# Patient Record
Sex: Female | Born: 1955 | Race: Asian | Hispanic: No | Marital: Married | Smoking: Never smoker
Health system: Southern US, Community
[De-identification: ages and names within clinical notes are randomized; demographics above are authoritative.]

## PROBLEM LIST (undated history)

## (undated) DIAGNOSIS — E785 Hyperlipidemia, unspecified: Secondary | ICD-10-CM

## (undated) DIAGNOSIS — I2510 Atherosclerotic heart disease of native coronary artery without angina pectoris: Secondary | ICD-10-CM

## (undated) HISTORY — PX: CHOLECYSTECTOMY: SHX55

## (undated) HISTORY — DX: Hyperlipidemia, unspecified: E78.5

## (undated) HISTORY — PX: ABDOMINAL HYSTERECTOMY: SHX81

---

## 1999-10-12 ENCOUNTER — Ambulatory Visit (HOSPITAL_COMMUNITY): Admission: RE | Admit: 1999-10-12 | Discharge: 1999-10-12 | Payer: Self-pay | Admitting: Cardiovascular Disease

## 2005-03-01 ENCOUNTER — Emergency Department (HOSPITAL_COMMUNITY): Admission: EM | Admit: 2005-03-01 | Discharge: 2005-03-02 | Payer: Self-pay | Admitting: Emergency Medicine

## 2008-10-06 ENCOUNTER — Encounter: Admission: RE | Admit: 2008-10-06 | Discharge: 2008-10-06 | Payer: Self-pay | Admitting: Internal Medicine

## 2008-10-12 ENCOUNTER — Encounter: Admission: RE | Admit: 2008-10-12 | Discharge: 2008-10-12 | Payer: Self-pay | Admitting: Internal Medicine

## 2009-10-20 ENCOUNTER — Encounter: Admission: RE | Admit: 2009-10-20 | Discharge: 2009-10-20 | Payer: Self-pay | Admitting: Internal Medicine

## 2010-09-28 ENCOUNTER — Other Ambulatory Visit: Payer: Self-pay | Admitting: Internal Medicine

## 2010-09-28 DIAGNOSIS — Z1231 Encounter for screening mammogram for malignant neoplasm of breast: Secondary | ICD-10-CM

## 2010-10-26 ENCOUNTER — Ambulatory Visit
Admission: RE | Admit: 2010-10-26 | Discharge: 2010-10-26 | Disposition: A | Discharge status: Home / Self Care | Payer: BC Managed Care – PPO | Source: Ambulatory Visit | Attending: Internal Medicine | Admitting: Internal Medicine

## 2010-10-26 DIAGNOSIS — Z1231 Encounter for screening mammogram for malignant neoplasm of breast: Secondary | ICD-10-CM

## 2010-10-27 ENCOUNTER — Ambulatory Visit: Payer: Self-pay

## 2010-10-31 ENCOUNTER — Other Ambulatory Visit: Payer: Self-pay | Admitting: Internal Medicine

## 2010-10-31 DIAGNOSIS — Z1231 Encounter for screening mammogram for malignant neoplasm of breast: Secondary | ICD-10-CM

## 2011-06-13 HISTORY — PX: CARDIAC CATHETERIZATION: SHX172

## 2011-09-28 ENCOUNTER — Emergency Department (HOSPITAL_COMMUNITY): Payer: BC Managed Care – PPO

## 2011-09-28 ENCOUNTER — Emergency Department (HOSPITAL_COMMUNITY)
Admission: EM | Admit: 2011-09-28 | Discharge: 2011-09-28 | Disposition: A | Discharge status: Home / Self Care | Payer: BC Managed Care – PPO | Attending: Emergency Medicine | Admitting: Emergency Medicine

## 2011-09-28 ENCOUNTER — Encounter (HOSPITAL_COMMUNITY): Payer: Self-pay | Admitting: *Deleted

## 2011-09-28 DIAGNOSIS — M25519 Pain in unspecified shoulder: Secondary | ICD-10-CM | POA: Insufficient documentation

## 2011-09-28 DIAGNOSIS — R079 Chest pain, unspecified: Secondary | ICD-10-CM | POA: Insufficient documentation

## 2011-09-28 DIAGNOSIS — R209 Unspecified disturbances of skin sensation: Secondary | ICD-10-CM | POA: Insufficient documentation

## 2011-09-28 LAB — POCT I-STAT TROPONIN I: Troponin i, poc: 0.01 ng/mL (ref 0.00–0.08)

## 2011-09-28 LAB — BASIC METABOLIC PANEL
BUN: 10 mg/dL (ref 6–23)
CO2: 24 mEq/L (ref 19–32)
Calcium: 9.8 mg/dL (ref 8.4–10.5)
Chloride: 102 mEq/L (ref 96–112)
Creatinine, Ser: 0.79 mg/dL (ref 0.50–1.10)
GFR calc Af Amer: >90 mL/min (ref >90)
GFR calc non Af Amer: >90 mL/min (ref >90)
Glucose, Bld: 98 mg/dL (ref 70–99)
Potassium: 3.2 mEq/L — ABNORMAL LOW (ref 3.5–5.1)
Sodium: 140 mEq/L (ref 135–145)

## 2011-09-28 LAB — DIFFERENTIAL
Basophils Relative: 1 % (ref 0–1)
Eosinophils Absolute: 0.1 10*3/uL (ref 0.0–0.7)
Eosinophils Relative: 2 % (ref 0–5)
Lymphocytes Relative: 21 % (ref 12–46)
Neutro Abs: 5.2 10*3/uL (ref 1.7–7.7)

## 2011-09-28 LAB — CBC
Hemoglobin: 13.5 g/dL (ref 12.0–15.0)
MCH: 29.7 pg (ref 26.0–34.0)
MCHC: 35.2 g/dL (ref 30.0–36.0)
RBC: 4.54 MIL/uL (ref 3.87–5.11)
WBC: 7.3 10*3/uL (ref 4.0–10.5)

## 2011-09-28 MED ORDER — POTASSIUM CHLORIDE CRYS ER 20 MEQ PO TBCR
40.0000 meq | EXTENDED_RELEASE_TABLET | Freq: Once | ORAL | Status: AC
  Administered 2011-09-28: 40 meq via ORAL
  Filled 2011-09-28: qty 5

## 2011-09-28 MED ORDER — ASPIRIN 81 MG PO CHEW
324.0000 mg | CHEWABLE_TABLET | Freq: Once | ORAL | Status: AC
  Administered 2011-09-28: 324 mg via ORAL
  Filled 2011-09-28: qty 4

## 2011-09-28 NOTE — ED Provider Notes (Signed)
Medical screening examination/treatment/procedure(s) were performed by non-physician practitioner and as supervising physician I was immediately available for consultation/collaboration.  Kyriaki Moder, MD 09/28/11 1748 

## 2011-09-28 NOTE — ED Notes (Signed)
Patient undressed and in a gown. Cardiac monitor, pulse oximetry, and blood pressure cuff on. 

## 2011-09-28 NOTE — ED Notes (Addendum)
Patient denies pain and is resting comfortably.  

## 2011-09-28 NOTE — ED Notes (Signed)
Pt reports chest pain that started this am, has tingling sensation down left arm. No distress noted, ekg done at triage.

## 2011-09-28 NOTE — ED Notes (Signed)
Pt returned from radiology.

## 2011-09-28 NOTE — ED Notes (Signed)
Patient transported to X-ray 

## 2011-09-28 NOTE — Discharge Instructions (Signed)
Continue taking your daily baby aspirin. Call [facility] cardiology tomorrow to verify followup appointment with either Dr. Johney Frame or one of the other cardiologists in 1-2 weeks. Explain that you were seen in the emergency department today and that Dr. Johney Frame took your information to have close followup established in 1-2 weeks with either him or a partner in the group. If you choose to go to [facility] to have heart catheterization then you need to call [facility] cardiology to cancel the followup appointment. However, at any time, if you have changing or worsening of symptoms to include but not limited to worsening chest pain then return to the closest ER for further evaluation.  Chest Pain (Nonspecific) Chest pain has many causes. Your pain could be caused by something serious, such as a heart attack or a blood clot in the lungs. It could also be caused by something less serious, such as a chest bruise or a virus. Follow up with your doctor. More lab tests or other studies may be needed to find the cause of your pain. Most of the time, nonspecific chest pain will improve within 2 to 3 days of rest and mild pain medicine. HOME CARE  For chest bruises, you may put ice on the sore area for 15 to 20 minutes, 3 to 4 times a day. Do this only if it makes you or your child feel better.   Put ice in a plastic bag.   Place a towel between the skin and the bag.   Rest for the next 2 to 3 days.   Go back to work if the pain improves.   See your doctor if the pain lasts longer than 1 to 2 weeks.   Only take medicine as told by your doctor.   Quit smoking if you smoke.  GET HELP RIGHT AWAY IF:   There is more pain or pain that spreads to the arm, neck, jaw, back, or belly (abdomen).   You or your child has shortness of breath.   You or your child coughs more than usual or coughs up blood.   You or your child has very bad back or belly pain, feels sick to his or her stomach (nauseous), or throws up  (vomits).   You or your child has very bad weakness.   You or your child passes out (faints).   You or your child has a temperature by mouth above 102 F (38.9 C), not controlled by medicine.  Any of these problems may be serious and may be an emergency. Do not wait to see if the problems will go away. Get medical help right away. Call your local emergency services 911 in [place] Do not drive yourself to the hospital. MAKE SURE YOU:   Understand these instructions.   Will watch this condition.   Will get help right away if you or your child is not doing well or gets worse.  Document Released: 11/15/2007 Document Revised: 05/18/2011 Document Reviewed: 11/15/2007 [facility] Patient Information 2012 [place], [place].

## 2011-09-28 NOTE — ED Provider Notes (Signed)
History     CSN: [id number]  Arrival date & time 09/28/11  0844   First MD Initiated Contact with Patient 09/28/11 [phone]      Chief Complaint  Patient presents with  . Chest Pain    (Consider location/radiation/quality/duration/timing/severity/associated sxs/prior treatment) HPI  Patient presents to emergency department complaining of acute onset left-sided chest discomfort with radiation of discomfort up into her upper left shoulder and tingling into her left arm. Patient states that she got up this morning feeling well and did chores around the house, took a shower, and then sat down to do prayers. Patient states that shortly after sitting down she began to feel "not well" and then had acute onset discomfort in the left side of her chest radiating up into her left upper shoulder with tingling in her left arm. Patient states symptoms lasted for seconds to minutes and then resolved. Patient states symptoms began around 6:30 AM this morning. Patient states that since onset she's had 3-4 similar episodes, lasting for seconds to minutes a piece. Patient denies associated fevers, chills, cough, shortness of breath, nausea, vomiting, diaphoresis, abdominal pain. Patient denies history of similar events. Patient states she takes a daily baby aspirin but has no known medical problems and takes no other medicines on regular basis. Patient has been seen by Dr. Allena Katz in [place] in the past but states she has not seen her recently. Patient denies alcohol, tobacco, and illicit drug use. Patient states that her mother died at age 51 of a heart attack without other known medical problems. Patient denies any recent illness. She denies lower extremity pain or swelling, exogenous estrogen use, recent travel or immobilization. She denies aggravating or alleviating factors. Patient states symptoms are acute onset, fleeting, and resolved. Patient states she has no pain currently, resting comfortably in bed without  complaint. Patient took her daily baby aspirin today. Patient states she saw Dr. Algie Coffer approximately 15 years ago for "heart check up" but states that her evaluation was normal and has not seen him since.   Patient was in [place] last week visiting her daughter and went to see a cardiologist who is a family friend with her husband and states she received a cardiac stress test there which was normal as well as having "normal labs." patient denies any CP at that time but states they were "being cautious because of my mom dying at 95." Patient states that the same cardiologist told her that he would perform a cardiac cath next week when she returns to [facility] to further rule out heart disease.   History reviewed. No pertinent past medical history.  History reviewed. No pertinent past surgical history.  History reviewed. No pertinent family history.  History  Substance Use Topics  . Smoking status: Not on file  . Smokeless tobacco: Not on file  . Alcohol Use: No    OB History    Grav Para Term Preterm Abortions TAB SAB Ect Mult Living                  Review of Systems  All other systems reviewed and are negative.    Allergies  Iodine  Home Medications   Current Outpatient Rx  Name Route Sig Dispense Refill  . ASPIRIN EC 81 MG PO TBEC Oral Take 81 mg by mouth daily.      BP 128/71  Pulse 89  Temp(Src) 98.4 F (36.9 C) (Oral)  Resp 20  SpO2 100%  Physical Exam  Nursing note  and vitals reviewed. Constitutional: She is oriented to person, place, and time. She appears well-developed and well-nourished. No distress.  HENT:  Head: Normocephalic and atraumatic.  Eyes: Conjunctivae are normal.  Neck: Normal range of motion. Neck supple.  Cardiovascular: Normal rate, regular rhythm, normal heart sounds and intact distal pulses.  Exam reveals no gallop and no friction rub.   No murmur heard. Pulmonary/Chest: Effort normal and breath sounds normal. No respiratory  distress. She has no wheezes. She has no rales. She exhibits no tenderness.  Abdominal: Soft. Bowel sounds are normal. She exhibits no distension and no mass. There is no tenderness. There is no rebound and no guarding.  Musculoskeletal: Normal range of motion. She exhibits no edema and no tenderness.  Neurological: She is alert and oriented to person, place, and time.  Skin: Skin is warm and dry. No rash noted. She is not diaphoretic. No erythema.  Psychiatric: She has a normal mood and affect.    ED Course  Procedures (including critical care time)  PO ASA to total 325mg    Date: 09/28/2011  Rate: 83  Rhythm: normal sinus rhythm  QRS Axis: normal  Intervals: normal  ST/T Wave abnormalities: nonspecific ST changes  Conduction Disutrbances:none  Narrative Interpretation:   Old EKG Reviewed: non provocative compared to Mar 01, 2005 with mild non specific ST depression inferiorly   Labs Reviewed  BASIC METABOLIC PANEL - Abnormal; Notable for the following:    Potassium 3.2 (*)    All other components within normal limits  CBC  DIFFERENTIAL  POCT I-STAT TROPONIN I   Dg Chest 2 View  09/28/2011  *RADIOLOGY REPORT*  Clinical Data: Chest pain  CHEST - 2 VIEW  Comparison: March 02, 2005  Findings: The cardiac silhouette, mediastinum, pulmonary vasculature are within normal limits.  Both lungs are clear. There is no acute bony abnormality.  IMPRESSION: There is no evidence of acute cardiac or pulmonary process.  Original Report Authenticated By: Brandon Melnick, M.D.     1. Chest pain        MDM   Patient had stress test/echo results faxed to emergency department from [facility] cardiovascular [facility] office in [place] on 08/30/11. Stress test and echo showed no ischemic changes. I spoke with Dr. Johney Frame about patient's HPI, ER findings, and recent stress test and he states that patient, despite family history of coronary artery disease, is very low-risk and appropriate  for outpatient followup with he or one of his partners in 1-2 weeks. He took the patient's information to establish followup. However, he states that if patient chooses to go to [facility] to have cardiac catheterization by another cardiologist then patient should cancel that appointment. Patient's future cardiology needs will be based on catheterization findings and or followup exam with cardiology in [place].  I gave strict precautions to patient and her son who are at bedside about reasons to return to closest ER. Patient voices her understanding and is agreeable to plan.       Jenness Corner, [place] 09/28/11 1208

## 2011-09-29 NOTE — Progress Notes (Signed)
Observation review is complete. [name] is being sent.

## 2011-10-17 ENCOUNTER — Telehealth: Payer: Self-pay | Admitting: Internal Medicine

## 2011-10-17 NOTE — Telephone Encounter (Signed)
10-17-11 per staff message from allred 09-28-11 pt was seen in the er, they wanted pt to be seen , per allred with one of the noninvasive dr's next available. 09-29-11 lmm to call , 10-03-11 lmm to call , and 10-05-11 lmm to call/mt

## 2011-11-03 ENCOUNTER — Other Ambulatory Visit: Payer: Self-pay | Admitting: Internal Medicine

## 2011-11-03 DIAGNOSIS — Z1231 Encounter for screening mammogram for malignant neoplasm of breast: Secondary | ICD-10-CM

## 2011-11-20 ENCOUNTER — Ambulatory Visit: Payer: BC Managed Care – PPO

## 2011-11-21 ENCOUNTER — Ambulatory Visit
Admission: RE | Admit: 2011-11-21 | Discharge: 2011-11-21 | Disposition: A | Discharge status: Home / Self Care | Payer: BC Managed Care – PPO | Source: Ambulatory Visit | Attending: Internal Medicine | Admitting: Internal Medicine

## 2011-11-21 DIAGNOSIS — Z1231 Encounter for screening mammogram for malignant neoplasm of breast: Secondary | ICD-10-CM

## 2012-09-26 ENCOUNTER — Encounter (HOSPITAL_COMMUNITY): Payer: Self-pay | Admitting: Emergency Medicine

## 2012-09-26 ENCOUNTER — Emergency Department (HOSPITAL_COMMUNITY): Payer: BC Managed Care – PPO

## 2012-09-26 ENCOUNTER — Observation Stay (HOSPITAL_COMMUNITY)
Admission: EM | Admit: 2012-09-26 | Discharge: 2012-09-27 | Disposition: A | Discharge status: Home / Self Care | Payer: BC Managed Care – PPO | Attending: Cardiology | Admitting: Cardiology

## 2012-09-26 DIAGNOSIS — I2510 Atherosclerotic heart disease of native coronary artery without angina pectoris: Secondary | ICD-10-CM | POA: Insufficient documentation

## 2012-09-26 DIAGNOSIS — R072 Precordial pain: Principal | ICD-10-CM | POA: Insufficient documentation

## 2012-09-26 DIAGNOSIS — I209 Angina pectoris, unspecified: Secondary | ICD-10-CM

## 2012-09-26 DIAGNOSIS — R079 Chest pain, unspecified: Secondary | ICD-10-CM

## 2012-09-26 DIAGNOSIS — R0602 Shortness of breath: Secondary | ICD-10-CM | POA: Insufficient documentation

## 2012-09-26 HISTORY — DX: Atherosclerotic heart disease of native coronary artery without angina pectoris: I25.10

## 2012-09-26 LAB — CBC
HCT: 34.6 % — ABNORMAL LOW (ref 36.0–46.0)
Hemoglobin: 12.2 g/dL (ref 12.0–15.0)
MCH: 29.5 pg (ref 26.0–34.0)
MCHC: 35.3 g/dL (ref 30.0–36.0)

## 2012-09-26 LAB — BASIC METABOLIC PANEL
BUN: 11 mg/dL (ref 6–23)
Calcium: 9.3 mg/dL (ref 8.4–10.5)
GFR calc non Af Amer: >90 mL/min (ref >90)
Glucose, Bld: 94 mg/dL (ref 70–99)

## 2012-09-26 MED ORDER — ASPIRIN 81 MG PO CHEW
162.0000 mg | CHEWABLE_TABLET | Freq: Once | ORAL | Status: AC
  Administered 2012-09-26: 162 mg via ORAL
  Filled 2012-09-26: qty 2

## 2012-09-26 MED ORDER — NITROGLYCERIN 0.4 MG SL SUBL
0.4000 mg | SUBLINGUAL_TABLET | SUBLINGUAL | Status: DC | PRN
  Administered 2012-09-26 – 2012-09-27 (×2): 0.4 mg via SUBLINGUAL
  Filled 2012-09-26: qty 25

## 2012-09-26 NOTE — ED Notes (Signed)
The pt  Is c/o lt upper chest pain since this am no sob no nausea no sob.Marland Kitchen  No previous history..  Pt alert no distress

## 2012-09-26 NOTE — ED Notes (Signed)
Pt c/o left shoulder and left sided neck pain with generalized weakness starting today; pt denies sob

## 2012-09-27 ENCOUNTER — Encounter (HOSPITAL_COMMUNITY): Payer: Self-pay | Admitting: Physician Assistant

## 2012-09-27 DIAGNOSIS — R079 Chest pain, unspecified: Secondary | ICD-10-CM

## 2012-09-27 DIAGNOSIS — I2510 Atherosclerotic heart disease of native coronary artery without angina pectoris: Secondary | ICD-10-CM

## 2012-09-27 DIAGNOSIS — R072 Precordial pain: Principal | ICD-10-CM | POA: Diagnosis present

## 2012-09-27 LAB — CBC
HCT: 31.7 % — ABNORMAL LOW (ref 36.0–46.0)
MCH: 28.8 pg (ref 26.0–34.0)
MCHC: 34.4 g/dL (ref 30.0–36.0)
MCV: 83.9 fL (ref 78.0–100.0)
RDW: 13.1 % (ref 11.5–15.5)

## 2012-09-27 LAB — CREATININE, SERUM: GFR calc non Af Amer: >90 mL/min (ref >90)

## 2012-09-27 LAB — TROPONIN I: Troponin I: <0.3 ng/mL (ref <0.30)

## 2012-09-27 MED ORDER — ENOXAPARIN SODIUM 40 MG/0.4ML ~~LOC~~ SOLN
40.0000 mg | SUBCUTANEOUS | Status: DC
  Administered 2012-09-27: 40 mg via SUBCUTANEOUS
  Filled 2012-09-27: qty 0.4

## 2012-09-27 MED ORDER — ASPIRIN EC 81 MG PO TBEC
81.0000 mg | DELAYED_RELEASE_TABLET | Freq: Every day | ORAL | Status: DC
  Filled 2012-09-27: qty 1

## 2012-09-27 MED ORDER — SODIUM CHLORIDE 0.9 % IV SOLN: 250.0000 mL | INTRAVENOUS | Status: DC | PRN

## 2012-09-27 MED ORDER — ATORVASTATIN CALCIUM 20 MG PO TABS
20.0000 mg | ORAL_TABLET | Freq: Every day | ORAL | Status: DC
  Filled 2012-09-27: qty 1

## 2012-09-27 MED ORDER — SODIUM CHLORIDE 0.9 % IJ SOLN: 3.0000 mL | INTRAMUSCULAR | Status: DC | PRN

## 2012-09-27 MED ORDER — ZOLPIDEM TARTRATE 5 MG PO TABS: 5.0000 mg | ORAL_TABLET | Freq: Every evening | ORAL | Status: DC | PRN

## 2012-09-27 MED ORDER — ALPRAZOLAM 0.25 MG PO TABS: 0.2500 mg | ORAL_TABLET | Freq: Two times a day (BID) | ORAL | Status: DC | PRN

## 2012-09-27 MED ORDER — SODIUM CHLORIDE 0.9 % IJ SOLN: 3.0000 mL | Freq: Two times a day (BID) | INTRAMUSCULAR | Status: DC

## 2012-09-27 MED ORDER — NITROGLYCERIN 0.4 MG SL SUBL: 0.4000 mg | SUBLINGUAL_TABLET | SUBLINGUAL | Status: DC | PRN

## 2012-09-27 MED ORDER — VITAMIN B-12 1000 MCG PO TABS
1000.0000 ug | ORAL_TABLET | Freq: Every day | ORAL | Status: DC
  Administered 2012-09-27: 1000 ug via ORAL
  Filled 2012-09-27 (×2): qty 1

## 2012-09-27 MED ORDER — ONDANSETRON HCL 4 MG/2ML IJ SOLN: 4.0000 mg | Freq: Four times a day (QID) | INTRAMUSCULAR | Status: DC | PRN

## 2012-09-27 MED ORDER — ACETAMINOPHEN 325 MG PO TABS: 650.0000 mg | ORAL_TABLET | ORAL | Status: DC | PRN

## 2012-09-27 NOTE — H&P (Signed)
CARDIOLOGY HISTORY AND PHYSICAL   Patient ID: Renee Fuentes MRN: [id number] DOB/AGE: 14-Aug-1955 57 y.o.  Admit date: 09/26/2012  Primary Physician   No PCP Per Patient Primary Cardiologist  Lennox Pippins M.D., [facility]   [address] [place], [place] [address] [phone]  Reason for Consultation   Chest pain  HQI:[id number] Renee Fuentes is a 57 y.o. female with a possible history of CAD, but no PCI.  She was in her usual state of health yesterday morning when she had onset of left-sided chest pain at approximately 10 AM. She had been active around the house doing things such as cooking and cleaning but denied shortness of breath or significant fatigue from these activities. The chest pain lasted about 15 minutes. She describes it as an uneasiness in her left upper chest area. She is not able to qualify it further. It was a 2/10. She may have been a little short of breath but was not nauseated or diaphoretic. She did well through lunch and after lunch and was busy can the afternoon when she had another episode of pain at 4 PM. This pain lasted a little bit longer. She felt a little dizzy. She came to the emergency room at 7 PM. The patient does not remember complaining of pain, but per ER records, she was having 7/10 chest pain. She was given aspirin 81 mg x2 and sublingual nitroglycerin x1. The chest pain improved but returned at 1 AM and she required another sublingual nitroglycerin. She did not have any pain overnight but during the interview she again developed left-sided chest pain. She seemed to be a little uncomfortable. She is concerned about the chest pain because her mother died at 56 with an MI.   Past Medical History  Diagnosis Date  . H/O cardiovascular stress test 2013    in [place]     Past Surgical History  Procedure Laterality Date  . Cardiac catheterization  2013    Performed in [facility], patient was told she had a stenosis but no PCI was performed.    Allergies   Allergen Reactions  . Iodine Itching and Swelling    I have reviewed the patient's current medications     nitroGLYCERIN  Prior to Admission medications   Medication Sig Start Date End Date Taking? Authorizing Provider  aspirin EC 81 MG tablet Take 81 mg by mouth daily.   Yes Historical Provider, MD  Cyanocobalamin (VITAMIN B-12 PO) Take 1 tablet by mouth daily.   Yes Historical Provider, MD  rosuvastatin (CRESTOR) 10 MG tablet Take 10 mg by mouth daily.   Yes Historical Provider, MD     History   Social History  . Marital Status: Married    Spouse Name: N/A    Number of Children: N/A  . Years of Education: N/A   Occupational History  . Self-employed    Social History Main Topics  . Smoking status: Never Smoker   . Smokeless tobacco: Not on file  . Alcohol Use: No  . Drug Use: No  . Sexually Active: Not on file   Other Topics Concern  . 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 .   No siblings have cardiac issues.    Family Status  Relation Status Death Age  . Mother Deceased 34    MI  . Father Alive     Currently 57 years old, no cardiac issues   ROS:  Full  14 point review of systems complete and found to be negative unless listed above.  Physical Exam: Blood pressure 118/57, pulse 56, temperature 97.7 F (36.5 C), temperature source Oral, resp. rate 12, SpO2 100.00%.  General: Well developed, well nourished, female in no acute distress Head: Eyes PERRLA, No xanthomas.   Normocephalic and atraumatic, oropharynx without edema or exudate. Dentition: Good Lungs: CTA bilaterally Heart: HRRR S1 S2, no rub/gallop, no murmur. pulses are 2+ all 4 extrem.   Neck: No carotid bruits. No lymphadenopathy.  JVD not elevated. Abdomen: Bowel sounds present, abdomen soft and non-tender without masses or hernias noted. Msk:  No spine or cva tenderness. No weakness, no joint deformities or effusions. Extremities: No clubbing or  cyanosis. No edema.  Neuro: Alert and oriented X 3. No focal deficits noted. Psych:  Good affect, responds appropriately Skin: No rashes or lesions noted.  Labs:   Lab Results  Component Value Date   WBC 5.5 09/26/2012   HGB 12.2 09/26/2012   HCT 34.6* 09/26/2012   MCV 83.6 09/26/2012   PLT 183 09/26/2012     Recent Labs Lab 09/26/12 1859  NA 144  K 3.7  CL 109  CO2 27  BUN 11  CREATININE 0.73  CALCIUM 9.3  GLUCOSE 94         Recent Labs  09/26/12 2345  TROPONINI <0.30    Recent Labs  09/26/12 1921  TROPIPOC 0.00    ECG:  SR, s brady with no acute ischemic changes.  Radiology:  Dg Chest 2 View 09/26/2012  *RADIOLOGY REPORT*  Clinical Data: Left-sided chest and shoulder pain  CHEST - 2 VIEW  Comparison: Chest x-ray of 09/28/2011  Findings: The lungs remain slightly hyperaerated with chronic changes.  No active infiltrate or effusion is seen.  Mediastinal contours are stable.  The heart is within upper limits of normal. No acute bony abnormality is seen.  IMPRESSION: Question mild emphysema.  No definite active process.   Original Report Authenticated By: Dwyane Dee, M.D.     ASSESSMENT AND PLAN:   The patient was seen today by Dr Shirlee Latch, the patient evaluated and the data reviewed.  Principal Problem:   Precordial pain - patient symptoms have typical and atypical teachers. She has had both resting and exertional pain. She got some improvement with sublingual nitroglycerin. Her cardiac risk factors are age and family history. She can be admitted to observation. We will continue to cycle cardiac enzymes. We will check a 2-D echocardiogram. If her cardiac enzymes remain negative, consider outpatient exercise Myoview. We'll contact her [place] physician for records.   SignedTheodore Demark, PA-C 09/27/2012 8:40 AM Alvino Chapel [phone]  Co-Sign MD  Patient seen with PA, agree with note.  Apparently had nonobstructive CAD on cath last year in [place].  She had somewhat  atypical chest pain yesterday.  ECG was unremarkable and cardiac enzymes so far negative.  Will plan to get an echo and serial cardiac enzymes/ECGs.  If she continues to have negative enzymes, will let her go home later today and followup for outpatient myoview on Monday.   Marca Ancona 09/27/2012 11:18 AM

## 2012-09-27 NOTE — ED Notes (Signed)
Patient resting with eyes closed

## 2012-09-27 NOTE — Discharge Summary (Signed)
Discharge Summary   Patient ID: Renee Fuentes,  MRN: [id number], DOB/AGE: 06/23/1955 57 y.o.  Admit date: 09/26/2012 Discharge date: 09/27/2012  Primary Physician: No PCP Per Patient Primary Cardiologist: Lennox Pippins M.D., [facility]  [address] [place], [place] [address] [phone]   Discharge Diagnoses Principal Problem:   Precordial pain Active Problems:   CAD (coronary artery disease), native coronary artery   Allergies Allergies  Allergen Reactions  . Iodine Itching and Swelling    Diagnostic Studies/Procedures  PA/LATERAL CHEST X-RAY- 09/26/12  IMPRESSION:  Question mild emphysema. No definite active process.    History of Present Illness Renee Fuentes is a 57 y.o. female who was observed overnight at [facility] with the above problem list.  She has a history of CAD- underwent cardiac catheterization in [place] in 2013. Underlying CAD was apparently nonobstructive. Did not require PCI. She sees a cardiologist in [place]. Her mother passed at 44 from a MI.   She was in her [place] yesterday morning when she experienced sudden-onset left-sided chest pain ~ 10 AM lasting about 15 minutes, located in the left upper chest area described as an uneasiness rated at a 2/10. She noted associated mild shortness of breath. No nausea or diaphoresis. She had been active performing household chores, and denied shortness of breath or fatigue with these activities. The pain subsided, she ate lunch, then experienced another episode around 4 PM which lasting a little longer and was associated with dizziness. She presented to the ED at 7 PM.   There, EKG revealed sinus bradycardia with nonspecific ST depressions < 1 mm in V4-V6, II, III, aVF. Initial trop-I WNL. CBC and BMET unremarkable. CXR as above indicated questionable mild emphysema, but no definite active process. She received ASA 81 x 2 and NTG SL x 1 with improvement. She remained in the ED overnight, and did require  an additional NTG for chest discomfort which did alleviate her pain.   Hospital Course   She was observed overnight and cardiac biomarkers were cycled. Two subsequent sets of trop-I returned WNL. She remained pain free and was deemed stable for discharge by Dr. Shirlee Latch. She was scheduled for an outpatient nuclear stress test and follow-up in the office as an outpatient. She will resume prior outpatient medications. This information, including outpatient stress test instructions, has been clearly outlined in the discharge AVS.   Discharge Vitals:  Blood pressure 111/71, pulse 79, temperature 98.5 F (36.9 C), temperature source Oral, resp. rate 14, height 5\' 2"  (1.575 m), weight 58.695 kg (129 lb 6.4 oz), SpO2 99.00%.   Labs: Recent Labs     09/26/12  1859  09/27/12  1154  WBC  5.5  4.7  HGB  12.2  10.9*  HCT  34.6*  31.7*  MCV  83.6  83.9  PLT  183  153    Recent Labs Lab 09/26/12 1859 09/27/12 1154  NA 144  --   K 3.7  --   CL 109  --   CO2 27  --   BUN 11  --   CREATININE 0.73 0.72  CALCIUM 9.3  --   GLUCOSE 94  --    Recent Labs     09/26/12  2345  09/27/12  1154  TROPONINI  <0.30  <0.30   Disposition:   Future Appointments Provider Department Dept Phone   10/07/2012 9:30 AM [facility] ([place] SITE 3 NUCLEAR MED [phone]   10/14/2012 10:10 AM Lorin Picket  Moishe Spice, PA-C [place] [facility] [place]) [phone]         Follow-up Information   Follow up with [facility] Main [facility]) On 10/07/2012. (9:00 am)    Contact information:   [address] [place] [address] [phone]      Follow up with Tereso Newcomer, PA-C On 10/14/2012. (at 10:10 am)    Contact information:   [address] [place] [address] [phone]      Discharge Medications:    Medication List    TAKE these medications       aspirin EC 81 MG tablet  Take 81 mg by mouth daily.      nitroGLYCERIN 0.4 MG SL tablet  Commonly known as:  NITROSTAT  Place 1 tablet (0.4 mg total) under the tongue every 5 (five) minutes as needed for chest pain.     rosuvastatin 10 MG tablet  Commonly known as:  CRESTOR  Take 10 mg by mouth daily.     VITAMIN B-12 PO  Take 1 tablet by mouth daily.       Outstanding Labs/Studies: nuclear stress test 10/07/12  Duration of Discharge Encounter: Greater than 30 minutes including physician time.  Signed, R. Hurman Horn, PA-C 09/27/2012, 5:02 PM

## 2012-09-27 NOTE — Progress Notes (Signed)
Utilization review completed.  

## 2012-09-27 NOTE — ED Notes (Signed)
BP down patient laying on her right side.  NS up

## 2012-09-27 NOTE — ED Provider Notes (Signed)
History     CSN: [id number]  Arrival date & time 09/26/12  1844   First MD Initiated Contact with Patient 09/26/12 2304      Chief Complaint  Patient presents with  . Chest Pain    (Consider location/radiation/quality/duration/timing/severity/associated sxs/prior treatment) Patient is a 57 y.o. female presenting with chest pain. The history is provided by the patient and medical records.  Chest Pain Pain location:  L chest Pain radiates to:  L shoulder Pain radiates to the back: no   Pain severity:  Moderate Onset quality:  Sudden Duration:  12 hours Timing:  Intermittent Chronicity:  New Context: not breathing, not at rest and no trauma   Relieved by:  Nitroglycerin Worsened by:  Exertion Ineffective treatments:  None tried Associated symptoms: no abdominal pain, no cough, no diaphoresis, no dizziness, no nausea, no palpitations, no shortness of breath and not vomiting     History reviewed. No pertinent past medical history.  History reviewed. No pertinent past surgical history.  History reviewed. No pertinent family history.  History  Substance Use Topics  . Smoking status: Never Smoker   . Smokeless tobacco: Not on file  . Alcohol Use: No    OB History   Grav Para Term Preterm Abortions TAB SAB Ect Mult Living                  Review of Systems  Constitutional: Negative for diaphoresis and activity change.  HENT: Negative for facial swelling and neck pain.   Respiratory: Negative for cough, shortness of breath and wheezing.   Cardiovascular: Positive for chest pain. Negative for palpitations.  Gastrointestinal: Negative for nausea, vomiting, abdominal pain, diarrhea, constipation, blood in stool and abdominal distention.  Genitourinary: Negative for hematuria and difficulty urinating.  Skin: Negative for color change.  Neurological: Negative for dizziness and speech difficulty.  Hematological: Does not bruise/bleed easily.  Psychiatric/Behavioral:  Negative for confusion.    Allergies  Iodine  Home Medications   Current Outpatient Rx  Name  Route  Sig  Dispense  Refill  . aspirin EC 81 MG tablet   Oral   Take 81 mg by mouth daily.         . Cyanocobalamin (VITAMIN B-12 PO)   Oral   Take 1 tablet by mouth daily.         . rosuvastatin (CRESTOR) 10 MG tablet   Oral   Take 10 mg by mouth daily.           BP 95/59  Pulse 52  Temp(Src) 97.7 F (36.5 C) (Oral)  Resp 16  SpO2 100%  Physical Exam  Nursing note and vitals reviewed. Constitutional: She is oriented to person, place, and time. She appears well-developed and well-nourished.  HENT:  Head: Normocephalic and atraumatic.  Eyes: EOM are normal. Pupils are equal, round, and reactive to light.  Neck: Neck supple.  Cardiovascular: Normal rate, regular rhythm and normal heart sounds.   No murmur heard. Pulmonary/Chest: Effort normal. No respiratory distress.  Abdominal: Soft. She exhibits no distension. There is no tenderness. There is no rebound and no guarding.  Neurological: She is alert and oriented to person, place, and time.  Skin: Skin is warm and dry.    ED Course  Procedures (including critical care time)  Labs Reviewed  CBC - Abnormal; Notable for the following:    HCT 34.6 (*)    All other components within normal limits  BASIC METABOLIC PANEL  TROPONIN I  POCT I-STAT  TROPONIN I   Dg Chest 2 View  09/26/2012  *RADIOLOGY REPORT*  Clinical Data: Left-sided chest and shoulder pain  CHEST - 2 VIEW  Comparison: Chest x-ray of 09/28/2011  Findings: The lungs remain slightly hyperaerated with chronic changes.  No active infiltrate or effusion is seen.  Mediastinal contours are stable.  The heart is within upper limits of normal. No acute bony abnormality is seen.  IMPRESSION: Question mild emphysema.  No definite active process.   Original Report Authenticated By: Dwyane Dee, M.D.      No diagnosis found.    MDM   Date: 09/27/2012  Rate:  78  Rhythm: normal sinus rhythm  QRS Axis: normal  Intervals: normal  ST/T Wave abnormalities: nonspecific ST/T changes  Conduction Disutrbances:none  Narrative Interpretation:   Old EKG Reviewed: new t wave inversion in v2, t wave flattening    Rate: 78  Rhythm: normal sinus rhythm  QRS Axis: normal  Intervals: normal  ST/T Wave abnormalities: nonspecific ST/T changes  Conduction Disutrbances:none  Narrative Interpretation:   Old EKG Reviewed: unchanged  Differential diagnosis includes: ACS syndrome CHF exacerbation Valvular disorder Myocarditis Pericarditis Pericardial effusion Pneumonia Pleural effusion Pulmonary edema PE Anemia Musculoskeletal pain  Pt comes in with cc of chest pain. Pt has no known CAD, and besides family hx, no risk factors. She had a cath in 2012 - at [facility], no intervention, but states that cath was not completely normal either. The pain today started with cleaning the home, and she had pain again at night post prandially. I called Cards, and they recommend close follow up at this time, unless we get the cath results suggesting CAD.  4:13 AM Pt's pain responding to nitro. States that she had pain at rest x 2 times here. Mild pain. Cath reports from significant arteriopathy, mid LAD, and distal LAD. Will call [facility] again.      Derwood Kaplan, MD 09/27/12 [phone]

## 2012-10-07 ENCOUNTER — Encounter (HOSPITAL_COMMUNITY): Payer: BC Managed Care – PPO

## 2012-10-09 ENCOUNTER — Ambulatory Visit (HOSPITAL_COMMUNITY): Payer: BC Managed Care – PPO | Attending: Cardiovascular Disease | Admitting: Radiology

## 2012-10-09 VITALS — BP 125/70 | HR 66 | Ht 62.0 in | Wt 126.0 lb

## 2012-10-09 DIAGNOSIS — I2510 Atherosclerotic heart disease of native coronary artery without angina pectoris: Secondary | ICD-10-CM

## 2012-10-09 DIAGNOSIS — R0789 Other chest pain: Secondary | ICD-10-CM | POA: Insufficient documentation

## 2012-10-09 DIAGNOSIS — Z8249 Family history of ischemic heart disease and other diseases of the circulatory system: Secondary | ICD-10-CM | POA: Insufficient documentation

## 2012-10-09 DIAGNOSIS — R0602 Shortness of breath: Secondary | ICD-10-CM | POA: Insufficient documentation

## 2012-10-09 DIAGNOSIS — R079 Chest pain, unspecified: Secondary | ICD-10-CM

## 2012-10-09 DIAGNOSIS — E785 Hyperlipidemia, unspecified: Secondary | ICD-10-CM | POA: Insufficient documentation

## 2012-10-09 MED ORDER — TECHNETIUM TC 99M SESTAMIBI GENERIC - CARDIOLITE
10.0000 | Freq: Once | INTRAVENOUS | Status: AC | PRN
  Administered 2012-10-09: 10 via INTRAVENOUS

## 2012-10-09 MED ORDER — TECHNETIUM TC 99M SESTAMIBI GENERIC - CARDIOLITE
30.0000 | Freq: Once | INTRAVENOUS | Status: AC | PRN
  Administered 2012-10-09: 30 via INTRAVENOUS

## 2012-10-09 NOTE — Progress Notes (Addendum)
[facility] SITE 3 NUCLEAR MED [address] [place], [place] [address] [phone]    Cardiology Nuclear Med Study  Renee Fuentes is a 57 y.o. female     MRN : [id number]     DOB: [birth year]  Procedure Date: 10/09/2012  Nuclear Med Background Indication for Stress Test:  Evaluation for Ischemia and Post Hospital  on 09/26/12 with CP; negative enzymes History:  '13 Cath in [place]:n/o CAD Cardiac Risk Factors: Family History - CAD and Lipids  Symptoms:  Chest Pain with and without Exertion (last episode of chest discomfort:none since discharge) and SOB   Nuclear Pre-Procedure Caffeine/Decaff Intake:  None NPO After: 7:00pm   Lungs:  Clear. O2 Sat: 100% on room air. IV 0.9% NS with Angio Cath:  22g  IV Site: R Antecubital  IV Started by:  Bonnita Levan, RN  Chest Size (in):  32 Cup Size: B  Height: 5\' 2"  (1.575 m)  Weight:  126 lb (57.153 kg)  BMI:  Body mass index is 23.04 kg/(m^2). Tech Comments:  N/A    Nuclear Med Study 1 or 2 day study: 1 day  Stress Test Type:  Stress  Reading MD: Kristeen Miss, MD  Order Authorizing Provider:  Marca Ancona, MD  Resting Radionuclide: Technetium 17m Sestamibi  Resting Radionuclide Dose: 11.0 mCi   Stress Radionuclide:  Technetium 86m Sestamibi  Stress Radionuclide Dose: 33.0 mCi           Stress Protocol Rest HR: 66 Stress HR: 169  Rest BP: 125/70 Stress BP: 169  Exercise Time (min): 8:31 METS: 9.6   Predicted Max HR: 162 bpm % Max HR: 104.32 bpm Rate Pressure Product: 57846   Dose of Adenosine (mg):  n/a Dose of Lexiscan: n/a mg  Dose of Atropine (mg): n/a Dose of Dobutamine: n/a mcg/kg/min (at max HR)  Stress Test Technologist: Smiley Houseman, CMA-N  Nuclear Technologist:  Domenic Polite, CNMT     Rest Procedure:  Myocardial perfusion imaging was performed at rest 45 minutes following the intravenous administration of Technetium 21m Sestamibi.  Rest ECG: NSR - Normal EKG  Stress Procedure:  The patient exercised  on the treadmill utilizing the Bruce Protocol for 8:31 minutes. The patient stopped due to fatigue and denied any chest pain.  She had occasional PAC's and short burst of SVT.  Technetium 20m Sestamibi was injected at peak exercise and myocardial perfusion imaging was performed after a brief delay.  Stress ECG: No significant ST segment change suggestive of ischemia.  QPS Raw Data Images:  Normal; no motion artifact; normal heart/lung ratio. Stress Images:  Normal homogeneous uptake in all areas of the myocardium. Rest Images:  Normal homogeneous uptake in all areas of the myocardium. Subtraction (SDS):  No evidence of ischemia. Transient Ischemic Dilatation (Normal <1.22):  0.98 Lung/Heart Ratio (Normal <0.45):  0.19  Quantitative Gated Spect Images QGS EDV:  52 ml QGS ESV:  17 ml  Impression Exercise Capacity:  Good exercise capacity. BP Response:  Normal blood pressure response. Clinical Symptoms:  No significant symptoms noted. ECG Impression:  No significant ST segment change suggestive of ischemia. Comparison with Prior Nuclear Study: No previous nuclear study performed  Overall Impression:  Normal stress nuclear study.  No evidence of ischemia.  LV Ejection Fraction: 68%.  LV Wall Motion:  NL LV Function; NL Wall Motion .   Renee Fuentes, Renee Fuentes., MD, [facility] 10/09/2012, 5:07 PM Office - [phone] Pager [phone]  Normal study, please inform patient.   Renee  Fuentes 10/10/2012 4:04 PM

## 2012-10-10 NOTE — Progress Notes (Signed)
Pt advised, she verbalized understanding. 

## 2012-10-14 ENCOUNTER — Ambulatory Visit (INDEPENDENT_AMBULATORY_CARE_PROVIDER_SITE_OTHER): Payer: BC Managed Care – PPO | Admitting: Physician Assistant

## 2012-10-14 ENCOUNTER — Encounter: Payer: Self-pay | Admitting: Physician Assistant

## 2012-10-14 VITALS — BP 118/68 | HR 64 | Ht 62.0 in | Wt 129.4 lb

## 2012-10-14 DIAGNOSIS — E785 Hyperlipidemia, unspecified: Secondary | ICD-10-CM

## 2012-10-14 DIAGNOSIS — R079 Chest pain, unspecified: Secondary | ICD-10-CM

## 2012-10-14 MED ORDER — ATORVASTATIN CALCIUM 20 MG PO TABS: 20.0000 mg | ORAL_TABLET | Freq: Every day | ORAL | Status: DC

## 2012-10-14 NOTE — Patient Instructions (Addendum)
WHEN YOU ARE DOWN TO 1 WEEK LEFT OF CRESTOR SAMPLES PLEASE CALL [phone] TO HAVE A NEW RX FOR ATORVASTATIN 20 MG DAILY SENT IN; YOU WILL ALSO NEED TO HAVE FASTING LIPID AND LIVER PANEL  TO BE DONE 6-8 WEEKS AFTER STARTING THE ATORVASTATIN   PLEASE CALL THE [facility] ON [address] TO SCHEDULE FOR A PRIMARY CARE PHYSICIAN [phone]  PLEASE FOLLOW UP WITH DR. Shirlee Latch 6 MONTHS

## 2012-10-14 NOTE — Progress Notes (Signed)
[address], [address] [place], [place]  [address] Phone: [phone] Fax:  [phone]  Date:  10/14/2012   ID:  Catha Brow, DOB 09/21/1955, MRN [id number]  PCP:  No PCP Per Patient  Primary Cardiologist:  Dr. Marca Ancona     History of Present Illness: Renee Fuentes is a 57 y.o. female who returns for follow up after a recent admission to the hospital 4/17-4/18 with chest pain. 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  This was apparently done after an abnormal thallium scan.  She was placed on a statin after her LHC.    She presented to [facility] with sudden onset left-sided chest pain. Cardiac enzymes remained negative. She was set for outpatient stress testing.  ETT-Myoview 10/09/12:  No ischemic ST changes on ECG, no ischemia, EF 68% (normal study).  Since d/c, she has done well.  The patient denies chest pain, shortness of breath, syncope, orthopnea, PND or significant pedal edema.   Labs (4/14):  K 3.2=>3.7, Cr 0.73, Hgb 10.9  Wt Readings from Last 3 Encounters:  10/14/12 129 lb 6.4 oz (58.695 kg)  10/09/12 126 lb (57.153 kg)  09/27/12 129 lb 6.4 oz (58.695 kg)     Past Medical History  Diagnosis Date  . CAD (coronary artery disease)     a. 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  ETT-Myoview 10/09/12:  No ischemic ST changes on ECG, no ischemia, EF 68% (normal study)  . HLD (hyperlipidemia)     Current Outpatient Prescriptions  Medication Sig Dispense Refill  . aspirin EC 81 MG tablet Take 81 mg by mouth daily.      . Cyanocobalamin (VITAMIN B-12 PO) Take 1 tablet by mouth daily.      . nitroGLYCERIN (NITROSTAT) 0.4 MG SL tablet Place 1 tablet (0.4  mg total) under the tongue every 5 (five) minutes as needed for chest pain.  25 tablet  3  . rosuvastatin (CRESTOR) 10 MG tablet Take 10 mg by mouth daily.       No current facility-administered medications for this visit.    Allergies:    Allergies  Allergen Reactions  . Iodine Itching and Swelling    Social History:  The patient  reports that she has never smoked. She has never used smokeless tobacco. She reports that she does not drink alcohol or use illicit drugs.   ROS:  Please see the history of present illness.   She has occasional indigestion.  All other systems reviewed and negative.   PHYSICAL EXAM: VS:  BP 118/68  Pulse 64  Ht 5\' 2"  (1.575 m)  Wt 129 lb 6.4 oz (58.695 kg)  BMI 23.66 kg/m2 Well nourished, well developed, in no acute distress HEENT: normal Neck: no JVD Cardiac:  normal S1, S2; RRR; no murmur Lungs:  clear to auscultation bilaterally, no wheezing, rhonchi or rales Abd: soft, nontender, no hepatomegaly Ext: no edema Skin: warm and dry Neuro:  CNs 2-12 intact, no focal abnormalities noted  EKG:  NSR, HR 64, NSSTTW changes     ASSESSMENT AND PLAN:  1. Chest Pain:  Resolved.  Reviewed [place] cath report from [place] in 09/2011.  She has a  description of "arteriopathy" in a small caliber mid to dist LAD.  I suppose this is referring to luminal irregularities.  She is not diabetic.  Continue ASA and statin.   2. Hyperlipidemia:  Crestor is too expensive.  Change to Lipitor 20 mg QD.  Check Lipids and LFTs in 6 weeks.   3. Disposition:  Refer to PCP.  F/u with Dr. Marca Ancona in 6 mos.   Luna Glasgow, PA-C  10:53 AM 10/14/2012

## 2012-10-15 ENCOUNTER — Other Ambulatory Visit: Payer: Self-pay

## 2012-12-11 ENCOUNTER — Other Ambulatory Visit: Payer: Self-pay

## 2012-12-11 DIAGNOSIS — Z1231 Encounter for screening mammogram for malignant neoplasm of breast: Secondary | ICD-10-CM

## 2013-01-08 ENCOUNTER — Ambulatory Visit
Admission: RE | Admit: 2013-01-08 | Discharge: 2013-01-08 | Disposition: A | Discharge status: Home / Self Care | Payer: BC Managed Care – PPO | Source: Ambulatory Visit

## 2013-01-08 DIAGNOSIS — Z1231 Encounter for screening mammogram for malignant neoplasm of breast: Secondary | ICD-10-CM

## 2013-04-17 ENCOUNTER — Other Ambulatory Visit: Payer: Self-pay

## 2013-10-20 ENCOUNTER — Other Ambulatory Visit: Payer: Self-pay | Admitting: Internal Medicine

## 2013-10-20 DIAGNOSIS — R1011 Right upper quadrant pain: Secondary | ICD-10-CM

## 2013-10-21 ENCOUNTER — Ambulatory Visit
Admission: RE | Admit: 2013-10-21 | Discharge: 2013-10-21 | Disposition: A | Discharge status: Home / Self Care | Payer: BC Managed Care – PPO | Source: Ambulatory Visit | Attending: Internal Medicine | Admitting: Internal Medicine

## 2013-10-21 DIAGNOSIS — R1011 Right upper quadrant pain: Secondary | ICD-10-CM

## 2013-12-15 ENCOUNTER — Other Ambulatory Visit: Payer: Self-pay

## 2013-12-15 DIAGNOSIS — Z1231 Encounter for screening mammogram for malignant neoplasm of breast: Secondary | ICD-10-CM

## 2014-01-06 ENCOUNTER — Other Ambulatory Visit: Payer: Self-pay | Admitting: Gastroenterology

## 2014-01-06 DIAGNOSIS — R1013 Epigastric pain: Secondary | ICD-10-CM

## 2014-01-09 ENCOUNTER — Ambulatory Visit
Admission: RE | Admit: 2014-01-09 | Discharge: 2014-01-09 | Disposition: A | Discharge status: Home / Self Care | Payer: BC Managed Care – PPO | Source: Ambulatory Visit

## 2014-01-09 DIAGNOSIS — Z1231 Encounter for screening mammogram for malignant neoplasm of breast: Secondary | ICD-10-CM

## 2014-01-19 ENCOUNTER — Ambulatory Visit (HOSPITAL_COMMUNITY)
Admission: RE | Admit: 2014-01-19 | Discharge: 2014-01-19 | Disposition: A | Discharge status: Home / Self Care | Payer: BC Managed Care – PPO | Source: Ambulatory Visit | Attending: Gastroenterology | Admitting: Gastroenterology

## 2014-01-19 DIAGNOSIS — R1013 Epigastric pain: Secondary | ICD-10-CM | POA: Insufficient documentation

## 2014-01-19 MED ORDER — TECHNETIUM TC 99M EXAMETAZIME IV KIT: 5.0000 | PACK | Freq: Once | INTRAVENOUS | Status: AC | PRN

## 2014-01-19 MED ORDER — STERILE WATER FOR INJECTION IJ SOLN
INTRAMUSCULAR | Status: AC
  Filled 2014-01-19: qty 10

## 2014-01-19 MED ORDER — REGADENOSON 0.4 MG/5ML IV SOLN
INTRAVENOUS | Status: AC
  Filled 2014-01-19: qty 5

## 2014-01-19 MED ORDER — SINCALIDE 5 MCG IJ SOLR: 0.0200 ug/kg | Freq: Once | INTRAMUSCULAR | Status: AC

## 2014-01-19 MED ORDER — SINCALIDE 5 MCG IJ SOLR
INTRAMUSCULAR | Status: AC
  Administered 2014-01-19: 1.17 ug via INTRAVENOUS
  Filled 2014-01-19: qty 5

## 2014-01-21 ENCOUNTER — Ambulatory Visit (HOSPITAL_COMMUNITY): Payer: BC Managed Care – PPO

## 2014-02-09 ENCOUNTER — Other Ambulatory Visit: Payer: Self-pay | Admitting: Gastroenterology

## 2014-02-09 DIAGNOSIS — R1013 Epigastric pain: Secondary | ICD-10-CM

## 2014-02-10 ENCOUNTER — Ambulatory Visit
Admission: RE | Admit: 2014-02-10 | Discharge: 2014-02-10 | Disposition: A | Discharge status: Home / Self Care | Payer: BC Managed Care – PPO | Source: Ambulatory Visit | Attending: Gastroenterology | Admitting: Gastroenterology

## 2014-02-10 DIAGNOSIS — R1013 Epigastric pain: Secondary | ICD-10-CM

## 2014-02-10 MED ORDER — IOHEXOL 300 MG/ML  SOLN
100.0000 mL | Freq: Once | INTRAMUSCULAR | Status: AC | PRN
  Administered 2014-02-10: 100 mL via INTRAVENOUS

## 2014-03-03 ENCOUNTER — Ambulatory Visit (INDEPENDENT_AMBULATORY_CARE_PROVIDER_SITE_OTHER): Payer: BC Managed Care – PPO | Admitting: General Surgery

## 2014-03-04 ENCOUNTER — Other Ambulatory Visit (INDEPENDENT_AMBULATORY_CARE_PROVIDER_SITE_OTHER): Payer: Self-pay | Admitting: Surgery

## 2015-01-07 ENCOUNTER — Other Ambulatory Visit: Payer: Self-pay | Admitting: Internal Medicine

## 2015-01-07 DIAGNOSIS — Z1231 Encounter for screening mammogram for malignant neoplasm of breast: Secondary | ICD-10-CM

## 2015-01-15 ENCOUNTER — Ambulatory Visit
Admission: RE | Admit: 2015-01-15 | Discharge: 2015-01-15 | Disposition: A | Discharge status: Home / Self Care | Payer: BLUE CROSS/BLUE SHIELD | Source: Ambulatory Visit | Attending: Internal Medicine | Admitting: Internal Medicine

## 2015-01-15 DIAGNOSIS — Z1231 Encounter for screening mammogram for malignant neoplasm of breast: Secondary | ICD-10-CM

## 2015-02-16 ENCOUNTER — Ambulatory Visit: Payer: Self-pay

## 2015-11-18 ENCOUNTER — Other Ambulatory Visit: Payer: Self-pay | Admitting: Gastroenterology

## 2015-11-18 DIAGNOSIS — R140 Abdominal distension (gaseous): Secondary | ICD-10-CM

## 2015-12-20 ENCOUNTER — Other Ambulatory Visit: Payer: BLUE CROSS/BLUE SHIELD

## 2015-12-28 ENCOUNTER — Other Ambulatory Visit: Payer: Self-pay | Admitting: Internal Medicine

## 2015-12-28 DIAGNOSIS — Z1231 Encounter for screening mammogram for malignant neoplasm of breast: Secondary | ICD-10-CM

## 2016-01-17 ENCOUNTER — Ambulatory Visit: Payer: BLUE CROSS/BLUE SHIELD

## 2016-01-17 ENCOUNTER — Ambulatory Visit
Admission: RE | Admit: 2016-01-17 | Discharge: 2016-01-17 | Disposition: A | Discharge status: Home / Self Care | Payer: BLUE CROSS/BLUE SHIELD | Source: Ambulatory Visit | Attending: Internal Medicine | Admitting: Internal Medicine

## 2016-01-17 DIAGNOSIS — Z1231 Encounter for screening mammogram for malignant neoplasm of breast: Secondary | ICD-10-CM

## 2016-01-18 ENCOUNTER — Ambulatory Visit
Admission: RE | Admit: 2016-01-18 | Discharge: 2016-01-18 | Disposition: A | Discharge status: Home / Self Care | Payer: BLUE CROSS/BLUE SHIELD | Source: Ambulatory Visit | Attending: Gastroenterology | Admitting: Gastroenterology

## 2016-01-18 ENCOUNTER — Other Ambulatory Visit: Payer: BLUE CROSS/BLUE SHIELD

## 2016-01-18 DIAGNOSIS — R140 Abdominal distension (gaseous): Secondary | ICD-10-CM

## 2016-12-18 ENCOUNTER — Other Ambulatory Visit: Payer: Self-pay | Admitting: Internal Medicine

## 2016-12-18 DIAGNOSIS — Z1231 Encounter for screening mammogram for malignant neoplasm of breast: Secondary | ICD-10-CM

## 2017-01-17 ENCOUNTER — Ambulatory Visit
Admission: RE | Admit: 2017-01-17 | Discharge: 2017-01-17 | Disposition: A | Discharge status: Home / Self Care | Payer: BLUE CROSS/BLUE SHIELD | Source: Ambulatory Visit | Attending: Internal Medicine | Admitting: Internal Medicine

## 2017-01-17 DIAGNOSIS — Z1231 Encounter for screening mammogram for malignant neoplasm of breast: Secondary | ICD-10-CM

## 2017-07-26 ENCOUNTER — Ambulatory Visit (INDEPENDENT_AMBULATORY_CARE_PROVIDER_SITE_OTHER): Payer: Self-pay | Admitting: Orthopedic Surgery

## 2017-08-06 ENCOUNTER — Ambulatory Visit (INDEPENDENT_AMBULATORY_CARE_PROVIDER_SITE_OTHER): Payer: Self-pay | Admitting: Orthopedic Surgery

## 2017-08-23 ENCOUNTER — Ambulatory Visit (INDEPENDENT_AMBULATORY_CARE_PROVIDER_SITE_OTHER): Payer: Self-pay

## 2017-08-23 ENCOUNTER — Ambulatory Visit (INDEPENDENT_AMBULATORY_CARE_PROVIDER_SITE_OTHER): Payer: Self-pay | Admitting: Orthopedic Surgery

## 2017-08-23 ENCOUNTER — Encounter (INDEPENDENT_AMBULATORY_CARE_PROVIDER_SITE_OTHER): Payer: Self-pay | Admitting: Orthopedic Surgery

## 2017-08-23 DIAGNOSIS — M79672 Pain in left foot: Secondary | ICD-10-CM

## 2017-08-23 DIAGNOSIS — M545 Low back pain: Secondary | ICD-10-CM

## 2017-08-23 NOTE — Progress Notes (Signed)
Office Visit Note   Patient: Renee Fuentes           Date of Birth: 1956/04/14           MRN: [id number] Visit Date: 08/23/2017 Requested by: Ralene Ok, MD [address] [place], [place] [address] PCP: Patient, No Pcp Per  Subjective: Chief Complaint  Patient presents with  . Lower Back - Pain  . Left Foot - Pain    HPI: Patient presents for evaluation of left foot pain as well as back pain and bilateral leg pain.  She states that on the left foot she reports some pain around the first MTP joint.  The pain is worse with activity.  Denies any history of injury.  Reports pain for the last month.  He has tried using BenGay with some relief.  She also takes Tylenol and Advil with some relief.  She also reports bilateral leg pain.  States that when she sits crosslegged the insides of her thighs have burning pain.  She does sit with her legs crossed when she goes to her temple.  She also reports mild low back pain.  Denies any fevers chills numbness or radiating symptoms.  No pain with walking in the back.              ROS: All systems reviewed are negative as they relate to the chief complaint within the history of present illness.  Patient denies  fevers or chills.   Assessment & Plan: Visit Diagnoses:  1. Pain in left foot   2. Low back pain, unspecified back pain laterality, unspecified chronicity, with sciatica presence unspecified     Plan: Impression is left MTP pain with no arthritis on x-rays.  She does have a small spur anterolaterally at the distal aspect of the metatarsal head.  She is wearing appropriate shoes.  No intervention required for the toe.  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  No nerve root tension signs and normal x-rays on the back.  In general I think she is going to manage with what she has now.  Injection into the left hip joint would be the next intervention required if her symptoms  worsen.  In regards to the back I think that that something we can image further and inject if her symptoms warrant.  Currently symptoms do not warrant.  Follow-up as needed  Follow-Up Instructions: Return if symptoms worsen or fail to improve.   Orders:  Orders Placed This Encounter  Procedures  . XR Foot Complete Left  . XR Lumbar Spine 2-3 Views   No orders of the defined types were placed in this encounter.     Procedures: No procedures performed   Clinical Data: No additional findings.  Objective: Vital Signs: There were no vitals taken for this visit.  Physical Exam:   Constitutional: Patient appears well-developed HEENT:  Head: Normocephalic Eyes:EOM are normal Neck: Normal range of motion Cardiovascular: Normal rate Pulmonary/chest: Effort normal Neurologic: Patient is alert Skin: Skin is warm Psychiatric: Patient has normal mood and affect    Ortho Exam: Orthopedic exam demonstrates full active and passive range of motion of the left ankle.  Patient has palpable intact nontender anterior to posterior to peroneal and Achilles tendons.  Does have tenderness to palpation anterolateral aspect of the MTP joint but full active and passive range of motion noted at the first MTP joint.  Pedal pulses palpable.  Sensation intact on the dorsal and plantar aspect of the foot.  Patient has no nerve root tension signs.  Mild groin pain on the left with internal rotation of the left hip.  Good hip flexion abduction and adduction strength.  Range of motion knees and ankles normal.  Pedal pulses palpable.  Specialty Comments:  No specialty comments available.  Imaging: Xr Foot Complete Left  Result Date: 08/23/2017 AP lateral oblique left foot reviewed.  No arthritis or fracture or dislocation is present.  Normal left foot  Xr Lumbar Spine 2-3 Views  Result Date: 08/23/2017 AP lateral lumbar spine reviewed.  No significant degenerative disc disease or facet arthritis is  present.  No spondylolisthesis or compression fractures.  Sacroiliac joints appear normal.  Mild degenerative change left hip.    PMFS History: Patient Active Problem List   Diagnosis Date Noted  . Precordial pain 09/27/2012  . CAD (coronary artery disease), native coronary artery 09/27/2012   Past Medical History:  Diagnosis Date  . CAD (coronary artery disease)    a. 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  ETT-Myoview 10/09/12:  No ischemic ST changes on ECG, no ischemia, EF 68% (normal study)  . HLD (hyperlipidemia)     Family History  Problem Relation Age of Onset  . Breast cancer Sister     Past Surgical History:  Procedure Laterality Date  . ABDOMINAL HYSTERECTOMY    . CARDIAC CATHETERIZATION  2013   [place] [facility] [place], patient was told she had a stenosis but no PCI was performed.   Social History   Occupational History  . Occupation: Self-employed  Tobacco Use  . Smoking status: Never Smoker  . Smokeless tobacco: Never Used  Substance and Sexual Activity  . Alcohol use: No  . Drug use: No  . Sexual activity: Not on file

## 2017-10-08 ENCOUNTER — Other Ambulatory Visit: Payer: Self-pay | Admitting: Internal Medicine

## 2017-10-08 ENCOUNTER — Ambulatory Visit
Admission: RE | Admit: 2017-10-08 | Discharge: 2017-10-08 | Disposition: A | Discharge status: Home / Self Care | Payer: PRIVATE HEALTH INSURANCE | Source: Ambulatory Visit | Attending: Internal Medicine | Admitting: Internal Medicine

## 2017-10-08 DIAGNOSIS — M542 Cervicalgia: Secondary | ICD-10-CM

## 2017-10-10 ENCOUNTER — Other Ambulatory Visit: Payer: Self-pay | Admitting: Internal Medicine

## 2017-10-10 DIAGNOSIS — M8580 Other specified disorders of bone density and structure, unspecified site: Secondary | ICD-10-CM

## 2017-10-22 ENCOUNTER — Ambulatory Visit (INDEPENDENT_AMBULATORY_CARE_PROVIDER_SITE_OTHER): Payer: Self-pay | Admitting: Orthopedic Surgery

## 2017-10-23 ENCOUNTER — Ambulatory Visit
Admission: RE | Admit: 2017-10-23 | Discharge: 2017-10-23 | Disposition: A | Discharge status: Home / Self Care | Payer: PRIVATE HEALTH INSURANCE | Source: Ambulatory Visit | Attending: Internal Medicine | Admitting: Internal Medicine

## 2017-10-23 DIAGNOSIS — M8580 Other specified disorders of bone density and structure, unspecified site: Secondary | ICD-10-CM

## 2017-11-02 ENCOUNTER — Encounter (INDEPENDENT_AMBULATORY_CARE_PROVIDER_SITE_OTHER): Payer: Self-pay | Admitting: Orthopedic Surgery

## 2017-11-02 ENCOUNTER — Ambulatory Visit (INDEPENDENT_AMBULATORY_CARE_PROVIDER_SITE_OTHER): Payer: PRIVATE HEALTH INSURANCE | Admitting: Orthopedic Surgery

## 2017-11-02 DIAGNOSIS — M542 Cervicalgia: Secondary | ICD-10-CM | POA: Diagnosis not present

## 2017-11-02 MED ORDER — MELOXICAM 15 MG PO TABS: ORAL_TABLET | ORAL | 0 refills | Status: DC
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
## 2017-11-02 NOTE — Addendum Note (Signed)
Addended by: Rip Harbour on: 11/02/2017 11:53 AM   Modules accepted: Orders

## 2017-12-18 ENCOUNTER — Other Ambulatory Visit: Payer: Self-pay | Admitting: Internal Medicine

## 2017-12-18 DIAGNOSIS — Z1231 Encounter for screening mammogram for malignant neoplasm of breast: Secondary | ICD-10-CM

## 2018-01-21 ENCOUNTER — Ambulatory Visit
Admission: RE | Admit: 2018-01-21 | Discharge: 2018-01-21 | Disposition: A | Discharge status: Home / Self Care | Payer: PRIVATE HEALTH INSURANCE | Source: Ambulatory Visit | Attending: Internal Medicine | Admitting: Internal Medicine

## 2018-01-21 DIAGNOSIS — Z1231 Encounter for screening mammogram for malignant neoplasm of breast: Secondary | ICD-10-CM

## 2018-12-19 ENCOUNTER — Ambulatory Visit: Payer: Self-pay

## 2018-12-19 ENCOUNTER — Ambulatory Visit (INDEPENDENT_AMBULATORY_CARE_PROVIDER_SITE_OTHER): Payer: Medicare Other | Admitting: Orthopedic Surgery

## 2018-12-19 ENCOUNTER — Other Ambulatory Visit: Payer: Self-pay

## 2018-12-19 ENCOUNTER — Encounter: Payer: Self-pay | Admitting: Orthopedic Surgery

## 2018-12-19 DIAGNOSIS — M545 Low back pain, unspecified: Secondary | ICD-10-CM

## 2018-12-19 DIAGNOSIS — M542 Cervicalgia: Secondary | ICD-10-CM

## 2018-12-20 ENCOUNTER — Encounter: Payer: Self-pay | Admitting: Orthopedic Surgery
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
## 2018-12-25 ENCOUNTER — Ambulatory Visit: Payer: Self-pay | Admitting: Cardiology

## 2018-12-30 ENCOUNTER — Encounter: Payer: Self-pay | Admitting: Cardiology

## 2018-12-31 ENCOUNTER — Other Ambulatory Visit: Payer: Self-pay

## 2018-12-31 ENCOUNTER — Ambulatory Visit (INDEPENDENT_AMBULATORY_CARE_PROVIDER_SITE_OTHER): Payer: Medicare Other | Admitting: Cardiology

## 2018-12-31 ENCOUNTER — Encounter: Payer: Self-pay | Admitting: Cardiology

## 2018-12-31 VITALS — BP 145/72 | HR 80 | Temp 99.3°F | Ht 62.0 in | Wt 125.0 lb

## 2018-12-31 DIAGNOSIS — M549 Dorsalgia, unspecified: Secondary | ICD-10-CM | POA: Diagnosis not present

## 2018-12-31 DIAGNOSIS — R030 Elevated blood-pressure reading, without diagnosis of hypertension: Secondary | ICD-10-CM

## 2018-12-31 DIAGNOSIS — E7800 Pure hypercholesterolemia, unspecified: Secondary | ICD-10-CM

## 2018-12-31 DIAGNOSIS — K219 Gastro-esophageal reflux disease without esophagitis: Secondary | ICD-10-CM | POA: Diagnosis not present
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
## 2019-01-06 ENCOUNTER — Other Ambulatory Visit: Payer: Self-pay | Admitting: Internal Medicine

## 2019-01-06 DIAGNOSIS — Z1231 Encounter for screening mammogram for malignant neoplasm of breast: Secondary | ICD-10-CM

## 2019-01-21 ENCOUNTER — Ambulatory Visit: Payer: BC Managed Care – PPO | Admitting: Cardiology

## 2019-01-23 DIAGNOSIS — I10 Essential (primary) hypertension: Secondary | ICD-10-CM | POA: Insufficient documentation

## 2019-01-23 DIAGNOSIS — E7800 Pure hypercholesterolemia, unspecified: Secondary | ICD-10-CM | POA: Insufficient documentation

## 2019-01-24 ENCOUNTER — Encounter: Payer: Self-pay | Admitting: Cardiology

## 2019-01-28 ENCOUNTER — Other Ambulatory Visit: Payer: Self-pay

## 2019-01-28 ENCOUNTER — Encounter: Payer: Self-pay | Admitting: Cardiology

## 2019-01-28 ENCOUNTER — Ambulatory Visit: Payer: BC Managed Care – PPO | Admitting: Cardiology

## 2019-02-13 ENCOUNTER — Other Ambulatory Visit: Payer: Self-pay

## 2019-02-13 ENCOUNTER — Ambulatory Visit (INDEPENDENT_AMBULATORY_CARE_PROVIDER_SITE_OTHER): Payer: Medicare Other | Admitting: Cardiology

## 2019-02-13 ENCOUNTER — Encounter: Payer: Self-pay | Admitting: Cardiology

## 2019-02-13 VITALS — BP 145/81 | HR 83 | Ht 62.0 in | Wt 123.6 lb

## 2019-02-13 DIAGNOSIS — E782 Mixed hyperlipidemia: Secondary | ICD-10-CM | POA: Diagnosis not present

## 2019-02-13 DIAGNOSIS — R030 Elevated blood-pressure reading, without diagnosis of hypertension: Secondary | ICD-10-CM | POA: Diagnosis not present

## 2019-02-13 DIAGNOSIS — I2510 Atherosclerotic heart disease of native coronary artery without angina pectoris: Secondary | ICD-10-CM

## 2019-02-13 DIAGNOSIS — R072 Precordial pain: Secondary | ICD-10-CM | POA: Diagnosis not present

## 2019-02-13 NOTE — Progress Notes (Signed)
Follow up visit  Subjective:   Renee Fuentes, female    DOB: December 13, 1955, 63 y.o.   MRN: [id number]   Chief Complaint  Patient presents with  . Chest Pain    back pain/shoulder    HPI  63 year old Asian [place] female with hyperlipidemia, GERD, here for follow-up of chest pain.  Patient has been doing well.  She has not had any recurrence of chest pain.  She walks up to 4 miles every day without any complaints.  Her upper back pain has improved with physical therapy.  Blood pressure is elevated today.  However, much lower at home visits.  She has upcoming visit with her PCP later this month.  Her cholesterol will also be rechecked at that visit.  Most recent LDL was 88 on rosuvastatin of 10 mg daily.   Past Medical History:  Diagnosis Date  . CAD (coronary artery disease)    a. 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  ETT-Myoview 10/09/12:  No ischemic ST changes on ECG, no ischemia, EF 68% (normal study)  . HLD (hyperlipidemia)      Past Surgical History:  Procedure Laterality Date  . ABDOMINAL HYSTERECTOMY    . CARDIAC CATHETERIZATION  2013   [place] [place] [facility] [place], patient was told she had a stenosis but no PCI was performed.     Social History   Socioeconomic History  . Marital status: Married    Spouse name: Not on file  . Number of children: 3  . Years of education: Not on file  . Highest education level: Not on file  Occupational History  . Occupation: Self-employed  Social Needs  . Financial resource strain: Not on file  . Food insecurity    Worry: Not on file    Inability: Not on file  . Transportation needs    Medical: Not on file    Non-medical: Not on file  Tobacco Use  . Smoking status: Never Smoker  . Smokeless tobacco: Never Used  Substance and Sexual Activity  . Alcohol use: No  . Drug use: No  .  Sexual activity: Not on file  Lifestyle  . Physical activity    Days per week: Not on file    Minutes per session: Not on file  . Stress: Not on file  Relationships  . Social Musicianconnections    Talks on phone: Not on file    Gets together: Not on file    Attends religious service: Not on file    Active member of club or organization: Not on file    Attends meetings of clubs or organizations: Not on file    Relationship status: Not on file  . Intimate partner violence    Fear of current or ex partner: Not on file    Emotionally abused: Not on file    Physically abused: Not on file    Forced sexual activity: Not on file  Other Topics Concern  . 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 .   No siblings have cardiac issues.     Family History  Problem Relation Age of Onset  . Hypotension Mother   . Healthy Father   . Breast cancer Sister      Current Outpatient Medications on File Prior to Visit  Medication Sig Dispense  Refill  . aspirin EC 81 MG tablet Take 81 mg by mouth daily.    . calcium carbonate (OS-CAL) 600 MG TABS tablet Take 600 mg by mouth every other day.    . Cholecalciferol (D3-1000 PO) Take 1 capsule by mouth daily.    . Cyanocobalamin (VITAMIN B-12 PO) Take 1 tablet by mouth daily.    . Multiple Vitamin (MULTIVITAMIN WITH MINERALS) TABS tablet Take 1 tablet by mouth daily.    Marland Kitchen omeprazole (PRILOSEC) 40 MG capsule Take 40 mg by mouth daily as needed.    . rosuvastatin (CRESTOR) 10 MG tablet Take 10 mg by mouth daily.     No current facility-administered medications on file prior to visit.     Cardiovascular studies:  EKG 02/13/2019: Sinus rhythm 83 bpm. Normal EKG.  Lexiscan Sestamibi stress test 03/16/2017: 1. SPECT images demonstrate normal homogeneous tracer distribution throughout the myocardium. 2. The calculated EF is at 80 %. Normal wall motion. 2 - 63mm horizontal ST segment depression in the inferolateral  leads during stress and persisted in recovery. This is strongly positive for ischemia. Changes reverted to normal in recovery at 2 minutes. 3.Low risk in view of normal perfusion and hyperdynamic LV systolic function, clinical correlataion highly recommended in view of abnormal EKG response with Lexiscan infusion.  Echocardiogram 04/03/2017: Left ventricle cavity is normal in size. Normal global wall motion. Normal diastolic filling pattern. Calculated EF 55%. Mild tricuspid regurgitation. Pulmonary artery systolic pressure is estimated at 30-35 mm Hg.   [facility] [place]  No significant high-grade stenosis. 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 Ejection fraction 60% with mildly elevated LVEDP   Recent labs: 01/2019 LDL 88   Review of Systems  Constitution: Negative for decreased appetite, malaise/fatigue, weight gain and weight loss.  HENT: Negative for congestion.   Eyes: Negative for visual disturbance.  Cardiovascular: Negative for chest pain, dyspnea on exertion, leg swelling, palpitations and syncope.  Respiratory: Negative for cough.   Endocrine: Negative for cold intolerance.  Hematologic/Lymphatic: Does not bruise/bleed easily.  Skin: Negative for itching and rash.  Musculoskeletal: Positive for back pain (Improved). Negative for myalgias.  Gastrointestinal: Negative for abdominal pain, nausea and vomiting.  Genitourinary: Negative for dysuria.  Neurological: Negative for dizziness and weakness.  Psychiatric/Behavioral: The patient is not nervous/anxious.   All other systems reviewed and are negative.        Vitals:   02/13/19 1426  BP: (!) 145/81  Pulse: 83  SpO2: 100%    Body mass index is 22.61 kg/m. Filed Weights   02/13/19 1426  Weight: 123 lb 9.6 oz (56.1 kg)     Objective:   Physical Exam  Constitutional: She is oriented to person, place, and time. She appears well-developed and  well-nourished. No distress.  HENT:  Head: Normocephalic and atraumatic.  Eyes: Pupils are equal, round, and reactive to light. Conjunctivae are normal.  Neck: No JVD present.  Cardiovascular: Normal rate, regular rhythm and intact distal pulses.  Pulmonary/Chest: Effort normal and breath sounds normal. She has no wheezes. She has no rales.  Abdominal: Soft. Bowel sounds are normal. There is no rebound.  Musculoskeletal:        General: No edema.  Lymphadenopathy:    She has no cervical adenopathy.  Neurological: She is alert and oriented to person, place, and time. No cranial nerve deficit.  Skin: Skin is warm and dry.  Psychiatric: She has a normal mood and affect.  Nursing note  and vitals reviewed.         Assessment & Recommendations:   63 year old Asian [place] female with hyperlipidemia, GERD, here for follow-up of chest pain.  Chest pain: Atypical, now resolved.  Elevated blood pressure without diagnosis of hypertension: Suspect whitecoat syndrome.  Continue home monitoring.  Hyperlipidemia: Has follow-up lipid panel later this month with her PCP.  If LDL still greater than 80, consider increasing rosuvastatin to 20 mg daily.  Discussed diet and lifestyle modifications, especially reducing saturated fat intake.  Given only mild CAD, okay to reduce aspirin to every other day.  Follow-up in 6 months.   Nigel Mormon, MD [facility]. [place] Pager: [phone] Office: [phone] If no answer Cell [phone]

## 2019-03-03 ENCOUNTER — Ambulatory Visit
Admission: RE | Admit: 2019-03-03 | Discharge: 2019-03-03 | Disposition: A | Discharge status: Home / Self Care | Payer: BC Managed Care – PPO | Source: Ambulatory Visit | Attending: Internal Medicine | Admitting: Internal Medicine

## 2019-03-03 ENCOUNTER — Other Ambulatory Visit: Payer: Self-pay

## 2019-03-03 DIAGNOSIS — Z1231 Encounter for screening mammogram for malignant neoplasm of breast: Secondary | ICD-10-CM

## 2019-03-12 ENCOUNTER — Encounter: Payer: Self-pay | Admitting: Orthopedic Surgery

## 2019-03-12 ENCOUNTER — Ambulatory Visit (INDEPENDENT_AMBULATORY_CARE_PROVIDER_SITE_OTHER): Payer: Medicare Other | Admitting: Orthopedic Surgery

## 2019-03-12 ENCOUNTER — Other Ambulatory Visit: Payer: Self-pay

## 2019-03-12 ENCOUNTER — Ambulatory Visit: Payer: Self-pay

## 2019-03-12 DIAGNOSIS — M25512 Pain in left shoulder: Secondary | ICD-10-CM | POA: Diagnosis not present

## 2019-03-14 ENCOUNTER — Encounter: Payer: Self-pay | Admitting: Orthopedic Surgery

## 2019-03-14 NOTE — Progress Notes (Signed)
Office Visit Note   Patient: Renee Fuentes           Date of Birth: 1955-10-29           MRN: [id number] Visit Date: 03/12/2019 Requested by: Ralene Ok, MD [id number] [place],  [place] [address] PCP: Ralene Ok, MD  Subjective: Chief Complaint  Patient presents with  . Lower Back - Pain    HPI: Patient presents for evaluation of back pain and left shoulder pain.  She describes continued pain in the back with some radiation into the legs and pelvis but she is able to walk 2 to 3 miles.  Denies any numbness and tingling in the feet and legs.  Does hurt her to stand at times.  She is tried acupuncture and takes occasional Tylenol.  Physical therapy is helping her T-spine pain.  This is the biggest issue that she had last clinic visit was pain between the shoulder blades.  Now the pain is migrated to her lower back.  She is been trying home remedies but really does not report any radicular symptoms.  Massage therapy is also been tried.  Patient also reports left shoulder and arm pain.  Occasional paresthesias in the left arm but not below the elbow.  This is been going on for 2 weeks.  Denies any neck pain.  She is able to go to the gym.              ROS: All systems reviewed are negative as they relate to the chief complaint within the history of present illness.  Patient denies  fevers or chills.   Assessment & Plan: Visit Diagnoses:  1. Left shoulder pain, unspecified chronicity     Plan: Impression is low back pain which initially started on the thoracic region now is in the lumbar region.  I think we should add physical therapy for the low back as well.  Regarding the left shoulder her exam and radiographs are normal.  This is something I think we can watch.  I think we should scan her in a month and the back if she is not improved.  This does not really look like spinal stenosis due to the lack of walking endurance diminishment.  Follow-Up Instructions: Return if  symptoms worsen or fail to improve.   Orders:  Orders Placed This Encounter  Procedures  . XR Shoulder Left   No orders of the defined types were placed in this encounter.     Procedures: No procedures performed   Clinical Data: No additional findings.  Objective: Vital Signs: There were no vitals taken for this visit.  Physical Exam:   Constitutional: Patient appears well-developed HEENT:  Head: Normocephalic Eyes:EOM are normal Neck: Normal range of motion Cardiovascular: Normal rate Pulmonary/chest: Effort normal Neurologic: Patient is alert Skin: Skin is warm Psychiatric: Patient has normal mood and affect    Ortho Exam: Ortho exam demonstrates full active and passive range of motion of the cervical spine and shoulders.  Excellent rotator cuff strength bilaterally.  No paresthesias and normal reflexes in the upper extremities.  Patient does have mild pain with forward lateral bending but no muscle atrophy in the legs.  Good ankle dorsiflexion plantarflexion quad hamstring strength.  Pedal pulses palpable.  No real direct tenderness to palpation in the sciatic notch region or SI joint region.  Specialty Comments:  No specialty comments available.  Imaging: No results found.   PMFS History: Patient Active Problem List   Diagnosis  Date Noted  . Elevated blood pressure reading in office without diagnosis of hypertension 02/13/2019  . Mixed hyperlipidemia 02/13/2019  . Benign labile hypertension 01/23/2019  . Hypercholesterolemia 01/23/2019  . Precordial pain 09/27/2012  . CAD (coronary artery disease), native coronary artery 09/27/2012   Past Medical History:  Diagnosis Date  . CAD (coronary artery disease)    a. 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  ETT-Myoview 10/09/12:  No ischemic ST changes on ECG, no ischemia, EF 68%  (normal study)  . HLD (hyperlipidemia)     Family History  Problem Relation Age of Onset  . Hypotension Mother   . Healthy Father   . Breast cancer Sister     Past Surgical History:  Procedure Laterality Date  . ABDOMINAL HYSTERECTOMY    . CARDIAC CATHETERIZATION  2013   Performed in [facility], patient was told she had a stenosis but no PCI was performed.   Social History   Occupational History  . Occupation: Self-employed  Tobacco Use  . Smoking status: Never Smoker  . Smokeless tobacco: Never Used  Substance and Sexual Activity  . Alcohol use: No  . Drug use: No  . Sexual activity: Not on file

## 2019-04-09 NOTE — Progress Notes (Signed)
Office Visit Note  Patient: Renee Fuentes             Date of Birth: 12-06-1955           MRN: [id number]             PCP: Ralene Ok, MD Referring: Charna Elizabeth, MD Visit Date: 04/23/2019 Occupation: @GUAROCC @  Subjective:  Pain in both hips.   History of Present Illness: Renee Fuentes is a 63 y.o. female seen in consultation per request of Dr. 76.  According to her she has been having some lower back pain and hip pain for a while.  She states she went to see Dr. Loreta Ave who did x-ray of her lumbar spine and then referred her to physical therapy.  She states that she has been going to physical therapy she has noticed improvement in her back symptoms.  She is also noticed improvement in her hip symptoms.  Although she continues to have some discomfort with walking.  She has some stiffness in her hands as well.  She denies any joint swelling.  She states symptoms got worse once the Covid hit and she was sitting for prolonged time.  Prior to that she was very active.  Activities of Daily Living:  Patient reports morning stiffness for 0 minutes.   Patient Denies nocturnal pain.  Difficulty dressing/grooming: Denies Difficulty climbing stairs: Denies Difficulty getting out of chair: Denies Difficulty using hands for taps, buttons, cutlery, and/or writing: Denies  Review of Systems  Constitutional: Negative for fatigue.  HENT: Negative for mouth sores, mouth dryness and nose dryness.   Eyes: Negative for itching and dryness.  Respiratory: Negative for shortness of breath, wheezing and difficulty breathing.   Cardiovascular: Negative for chest pain and palpitations.  Gastrointestinal: Negative for abdominal pain, blood in stool, constipation and diarrhea.  Endocrine: Negative for increased urination.  Genitourinary: Negative for difficulty urinating and painful urination.  Musculoskeletal: Negative for arthralgias, joint pain, joint swelling and morning stiffness.   Skin: Negative for rash.  Allergic/Immunologic: Negative for susceptible to infections.  Neurological: Positive for numbness. Negative for dizziness, light-headedness, headaches, memory loss and weakness.  Hematological: Positive for bruising/bleeding tendency.  Psychiatric/Behavioral: Negative for confusion and sleep disturbance.    PMFS History:  Patient Active Problem List   Diagnosis Date Noted  . Elevated blood pressure reading in office without diagnosis of hypertension 02/13/2019  . Mixed hyperlipidemia 02/13/2019  . Benign labile hypertension 01/23/2019  . Hypercholesterolemia 01/23/2019  . Precordial pain 09/27/2012  . CAD (coronary artery disease), native coronary artery 09/27/2012    Past Medical History:  Diagnosis Date  . CAD (coronary artery disease)    a. 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  ETT-Myoview 10/09/12:  No ischemic ST changes on ECG, no ischemia, EF 68% (normal study)  . HLD (hyperlipidemia)     Family History  Problem Relation Age of Onset  . Hypotension Mother   . Healthy Father   . Breast cancer Sister   . Healthy Son   . Healthy Daughter   . Healthy Daughter    Past Surgical History:  Procedure Laterality Date  . ABDOMINAL HYSTERECTOMY    . CARDIAC CATHETERIZATION  2013   Performed in [facility], patient was told she had a stenosis but no PCI was performed.  . 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 .   No siblings have cardiac issues.    There is no immunization history on file for this patient.   Objective: Vital Signs: BP 136/77 (BP Location: Right Arm, Patient Position: Sitting, Cuff Size: Normal)   Pulse 69   Resp 11   Ht 5' 1.5" (1.562 m)   Wt 124 lb 12.8 oz (56.6 kg)   BMI 23.20 kg/m    Physical Exam Vitals signs  and nursing note reviewed.  Constitutional:      Appearance: She is well-developed.  HENT:     Head: Normocephalic and atraumatic.  Eyes:     Conjunctiva/sclera: Conjunctivae normal.  Neck:     Musculoskeletal: Normal range of motion.  Cardiovascular:     Rate and Rhythm: Normal rate and regular rhythm.     Heart sounds: Normal heart sounds.  Pulmonary:     Effort: Pulmonary effort is normal.     Breath sounds: Normal breath sounds.  Abdominal:     General: Bowel sounds are normal.     Palpations: Abdomen is soft.  Lymphadenopathy:     Cervical: No cervical adenopathy.  Skin:    General: Skin is warm and dry.     Capillary Refill: Capillary refill takes less than 2 seconds.  Neurological:     Mental Status: She is alert and oriented to person, place, and time.  Psychiatric:        Behavior: Behavior normal.      Musculoskeletal Exam: C-spine thoracic and lumbar spine were in good range of motion.  Shoulder joints elbow joints wrist joints with good range of motion.  She had mild PIP and DIP thickening consistent with osteoarthritis.  Hip joints, knee joints, ankles, MTPs and PIPs with good range of motion.  She has tenderness on palpation of bilateral trochanteric bursa consistent with trochanteric bursitis.  CDAI Exam: CDAI Score: - Patient Global: -; Provider Global: - Swollen: -; Tender: - Joint Exam   No joint exam has been documented for this visit   There is currently no information documented on the homunculus. Go to the Rheumatology activity and complete the homunculus joint exam.  Investigation: No additional findings.  Imaging: No results found.  Recent Labs: Lab Results  Component Value Date   WBC 4.7 09/27/2012   HGB 10.9 (L) 09/27/2012   PLT 153 09/27/2012   NA 144 09/26/2012   K 3.7 09/26/2012   CL 109 09/26/2012   CO2 27 09/26/2012   GLUCOSE 94 09/26/2012   BUN 11 09/26/2012   CREATININE 0.72 09/27/2012   CALCIUM 9.3 09/26/2012   GFRAA >90  09/27/2012    Speciality Comments: No specialty comments available.  Procedures:  No procedures performed Allergies: Iodine   Assessment / Plan:     Visit Diagnoses: Trochanteric bursitis of both hips-patient had tenderness on palpation of the bilateral trochanteric bursa.  Detailed counseling guarding trochanteric bursitis and IT band syndrome was provided.  Some of the exercises were demonstrated in the office.  Her symptoms are not severe enough to require injection.  Have advised if her symptoms get worse she can contact me.  She is doing better since she has been going for physical therapy.  I have given her a handout on IT band exercises.  She is also using some topical agents which are helpful.  Primary osteoarthritis of both hands-she has some stiffness in her hands.  She has  no synovitis.  Clinical findings are consistent with osteoarthritis.  Joint protection was discussed.  Other medical problems are listed as follows:  Coronary artery disease involving native coronary artery of native heart, angina presence unspecified  Benign labile hypertension  Precordial pain  Mixed hyperlipidemia  History of gastroesophageal reflux (GERD)  History of anxiety  History of iron deficiency anemia  Orders: No orders of the defined types were placed in this encounter.  No orders of the defined types were placed in this encounter.     Follow-Up Instructions: Return if symptoms worsen or fail to improve, for Trochanteric bursitis.   Pollyann SavoyShaili Malyiah Fellows, MD  Note - This record has been created using Animal nutritionistDragon software.  Chart creation errors have been sought, but may not always  have been located. Such creation errors do not reflect on  the standard of medical care.

## 2019-04-13 ENCOUNTER — Other Ambulatory Visit: Payer: Self-pay | Admitting: Cardiology

## 2019-04-13 DIAGNOSIS — R030 Elevated blood-pressure reading, without diagnosis of hypertension: Secondary | ICD-10-CM

## 2019-04-13 MED ORDER — PROPRANOLOL HCL 20 MG PO TABS: 20.0000 mg | ORAL_TABLET | Freq: Three times a day (TID) | ORAL | 2 refills | Status: DC | PRN

## 2019-04-23 ENCOUNTER — Encounter: Payer: Self-pay | Admitting: Rheumatology

## 2019-04-23 ENCOUNTER — Ambulatory Visit (INDEPENDENT_AMBULATORY_CARE_PROVIDER_SITE_OTHER): Payer: Medicare Other | Admitting: Rheumatology

## 2019-04-23 ENCOUNTER — Other Ambulatory Visit: Payer: Self-pay

## 2019-04-23 VITALS — BP 136/77 | HR 69 | Resp 11 | Ht 61.5 in | Wt 124.8 lb

## 2019-04-23 DIAGNOSIS — Z862 Personal history of diseases of the blood and blood-forming organs and certain disorders involving the immune mechanism: Secondary | ICD-10-CM

## 2019-04-23 DIAGNOSIS — M19042 Primary osteoarthritis, left hand: Secondary | ICD-10-CM

## 2019-04-23 DIAGNOSIS — I10 Essential (primary) hypertension: Secondary | ICD-10-CM

## 2019-04-23 DIAGNOSIS — R072 Precordial pain: Secondary | ICD-10-CM

## 2019-04-23 DIAGNOSIS — M19041 Primary osteoarthritis, right hand: Secondary | ICD-10-CM

## 2019-04-23 DIAGNOSIS — I2510 Atherosclerotic heart disease of native coronary artery without angina pectoris: Secondary | ICD-10-CM | POA: Diagnosis not present

## 2019-04-23 DIAGNOSIS — M7062 Trochanteric bursitis, left hip: Secondary | ICD-10-CM

## 2019-04-23 DIAGNOSIS — M7061 Trochanteric bursitis, right hip: Secondary | ICD-10-CM

## 2019-04-23 DIAGNOSIS — M2550 Pain in unspecified joint: Secondary | ICD-10-CM

## 2019-04-23 DIAGNOSIS — E782 Mixed hyperlipidemia: Secondary | ICD-10-CM

## 2019-04-23 DIAGNOSIS — Z8659 Personal history of other mental and behavioral disorders: Secondary | ICD-10-CM

## 2019-04-23 DIAGNOSIS — Z8719 Personal history of other diseases of the digestive system: Secondary | ICD-10-CM

## 2019-04-23 NOTE — Patient Instructions (Signed)
Iliotibial Band Syndrome Rehab Ask your health care provider which exercises are safe for you. Do exercises exactly as told by your health care provider and adjust them as directed. It is normal to feel mild stretching, pulling, tightness, or discomfort as you do these exercises. Stop right away if you feel sudden pain or your pain gets significantly worse. Do not begin these exercises until told by your health care provider. Stretching and range-of-motion exercises These exercises warm up your muscles and joints and improve the movement and flexibility of your hip and pelvis. Quadriceps stretch, prone  1. Lie on your abdomen on a firm surface, such as a bed or padded floor (prone position). 2. Bend your left / right knee and reach back to hold your ankle or pant leg. If you cannot reach your ankle or pant leg, loop a belt around your foot and grab the belt instead. 3. Gently pull your heel toward your buttocks. Your knee should not slide out to the side. You should feel a stretch in the front of your thigh and knee (quadriceps). 4. Hold this position for __________ seconds. Repeat __________ times. Complete this exercise __________ times a day. Iliotibial band stretch An iliotibial band is a strong band of muscle tissue that runs from the outer side of your hip to the outer side of your thigh and knee. 1. Lie on your side with your left / right leg in the top position. 2. Bend both of your knees and grab your left / right ankle. Stretch out your bottom arm to help you balance. 3. Slowly bring your top knee back so your thigh goes behind your trunk. 4. Slowly lower your top leg toward the floor until you feel a gentle stretch on the outside of your left / right hip and thigh. If you do not feel a stretch and your knee will not fall farther, place the heel of your other foot on top of your knee and pull your knee down toward the floor with your foot. 5. Hold this position for __________ seconds.  Repeat __________ times. Complete this exercise __________ times a day. Strengthening exercises These exercises build strength and endurance in your hip and pelvis. Endurance is the ability to use your muscles for a long time, even after they get tired. Straight leg raises, side-lying This exercise strengthens the muscles that rotate the leg at the hip and move it away from your body (hip abductors). 1. Lie on your side with your left / right leg in the top position. Lie so your head, shoulder, hip, and knee line up. You may bend your bottom knee to help you balance. 2. Roll your hips slightly forward so your hips are stacked directly over each other and your left / right knee is facing forward. 3. Tense the muscles in your outer thigh and lift your top leg 4-6 inches (10-15 cm). 4. Hold this position for __________ seconds. 5. Slowly return to the starting position. Let your muscles relax completely before doing another repetition. Repeat __________ times. Complete this exercise __________ times a day. Leg raises, prone This exercise strengthens the muscles that move the hips (hip extensors). 1. Lie on your abdomen on your bed or a firm surface. You can put a pillow under your hips if that is more comfortable for your lower back. 2. Bend your left / right knee so your foot is straight up in the air. 3. Squeeze your buttocks muscles and lift your left / right thigh   off the bed. Do not let your back arch. 4. Tense your thigh muscle as hard as you can without increasing any knee pain. 5. Hold this position for __________ seconds. 6. Slowly lower your leg to the starting position and allow it to relax completely. Repeat __________ times. Complete this exercise __________ times a day. Hip hike 1. Stand sideways on a bottom step. Stand on your left / right leg with your other foot unsupported next to the step. You can hold on to the railing or wall for balance if needed. 2. Keep your knees straight  and your torso square. Then lift your left / right hip up toward the ceiling. 3. Slowly let your left / right hip lower toward the floor, past the starting position. Your foot should get closer to the floor. Do not lean or bend your knees. Repeat __________ times. Complete this exercise __________ times a day. This information is not intended to replace advice given to you by your health care provider. Make sure you discuss any questions you have with your health care provider. Document Released: 05/29/2005 Document Revised: 09/19/2018 Document Reviewed: 03/20/2018 Elsevier Patient Education  2020 Elsevier Inc.  

## 2019-05-02 NOTE — Progress Notes (Deleted)
Office Visit Note  Patient: Renee Fuentes             Date of Birth: 06-Sep-1955           MRN: [id number]             PCP: Jilda Panda, MD Referring: Jilda Panda, MD Visit Date: 05/15/2019 Occupation: @GUAROCC @  Subjective:  No chief complaint on file.   History of Present Illness: Renee Fuentes is a 63 y.o. female ***   Activities of Daily Living:  Patient reports morning stiffness for *** {minute/hour:19697}.   Patient {ACTIONS;DENIES/REPORTS:21021675::"Denies"} nocturnal pain.  Difficulty dressing/grooming: {ACTIONS;DENIES/REPORTS:21021675::"Denies"} Difficulty climbing stairs: {ACTIONS;DENIES/REPORTS:21021675::"Denies"} Difficulty getting out of chair: {ACTIONS;DENIES/REPORTS:21021675::"Denies"} Difficulty using hands for taps, buttons, cutlery, and/or writing: {ACTIONS;DENIES/REPORTS:21021675::"Denies"}  No Rheumatology ROS completed.   PMFS History:  Patient Active Problem List   Diagnosis Date Noted  . Elevated blood pressure reading in office without diagnosis of hypertension 02/13/2019  . Mixed hyperlipidemia 02/13/2019  . Benign labile hypertension 01/23/2019  . Hypercholesterolemia 01/23/2019  . Precordial pain 09/27/2012  . CAD (coronary artery disease), native coronary artery 09/27/2012    Past Medical History:  Diagnosis Date  . CAD (coronary artery disease)    a. 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  ETT-Myoview 10/09/12:  No ischemic ST changes on ECG, no ischemia, EF 68% (normal study)  . HLD (hyperlipidemia)     Family History  Problem Relation Age of Onset  . Hypotension Mother   . Healthy Father   . Breast cancer Sister   . Healthy Son   . Healthy Daughter   . Healthy Daughter    Past Surgical History:  Procedure Laterality Date  . ABDOMINAL HYSTERECTOMY    . CARDIAC CATHETERIZATION   2013   Performed in [facility], patient was told she had a stenosis but no PCI was performed.  . 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 .   No siblings have cardiac issues.    There is no immunization history on file for this patient.   Objective: Vital Signs: There were no vitals taken for this visit.   Physical Exam   Musculoskeletal Exam: ***  CDAI Exam: CDAI Score: - Patient Global: -; Provider Global: - Swollen: -; Tender: - Joint Exam   No joint exam has been documented for this visit   There is currently no information documented on the homunculus. Go to the Rheumatology activity and complete the homunculus joint exam.  Investigation: No additional findings.  Imaging: No results found.  Recent Labs: Lab Results  Component Value Date   WBC 4.7 09/27/2012   HGB 10.9 (L) 09/27/2012   PLT 153 09/27/2012   NA 144 09/26/2012   K 3.7 09/26/2012   CL 109 09/26/2012   CO2 27 09/26/2012   GLUCOSE 94 09/26/2012   BUN 11 09/26/2012   CREATININE 0.72 09/27/2012   CALCIUM 9.3 09/26/2012   GFRAA >90 09/27/2012    Speciality Comments: No specialty comments available.  Procedures:  No procedures performed Allergies: Iodine   Assessment / Plan:     Visit Diagnoses: No diagnosis found.  Orders: No orders of the defined types were placed in this encounter.  No orders of the defined types were placed in this encounter.  Face-to-face time spent with patient was *** minutes. Greater than 50% of time was spent in counseling and coordination of care.  Follow-Up Instructions: No follow-ups on file.   Bo Merino, MD  Note - This record has been created using Editor, commissioning.  Chart creation errors have been sought, but may not always  have been located. Such creation errors do not reflect on  the standard of medical care.

## 2019-05-13 ENCOUNTER — Other Ambulatory Visit: Payer: Self-pay

## 2019-05-13 DIAGNOSIS — Z20822 Contact with and (suspected) exposure to covid-19: Secondary | ICD-10-CM

## 2019-05-15 ENCOUNTER — Ambulatory Visit: Payer: BC Managed Care – PPO | Admitting: Rheumatology

## 2019-05-15 LAB — NOVEL CORONAVIRUS, NAA: SARS-CoV-2, NAA: NOT DETECTED

## 2019-06-19 ENCOUNTER — Other Ambulatory Visit: Payer: Self-pay | Admitting: Internal Medicine

## 2019-06-19 DIAGNOSIS — R102 Pelvic and perineal pain: Secondary | ICD-10-CM

## 2019-07-01 ENCOUNTER — Other Ambulatory Visit: Payer: Medicare Other

## 2019-07-08 ENCOUNTER — Ambulatory Visit: Payer: Medicare Other

## 2019-07-10 ENCOUNTER — Ambulatory Visit
Admission: RE | Admit: 2019-07-10 | Discharge: 2019-07-10 | Disposition: A | Discharge status: Home / Self Care | Payer: Medicare Other | Source: Ambulatory Visit | Attending: Internal Medicine | Admitting: Internal Medicine

## 2019-07-10 DIAGNOSIS — R102 Pelvic and perineal pain: Secondary | ICD-10-CM

## 2019-07-11 ENCOUNTER — Ambulatory Visit: Payer: Medicare Other | Attending: Internal Medicine

## 2019-07-11 DIAGNOSIS — Z20822 Contact with and (suspected) exposure to covid-19: Secondary | ICD-10-CM

## 2019-07-12 LAB — NOVEL CORONAVIRUS, NAA: SARS-CoV-2, NAA: NOT DETECTED

## 2019-07-17 ENCOUNTER — Ambulatory Visit: Payer: Medicare Other

## 2019-07-29 ENCOUNTER — Ambulatory Visit: Payer: Medicare Other

## 2019-08-11 ENCOUNTER — Ambulatory Visit: Payer: BC Managed Care – PPO | Admitting: Cardiology

## 2019-08-18 ENCOUNTER — Ambulatory Visit: Payer: Medicare Other | Admitting: Cardiology

## 2019-08-18 ENCOUNTER — Other Ambulatory Visit: Payer: Self-pay

## 2019-08-18 ENCOUNTER — Encounter: Payer: Self-pay | Admitting: Cardiology

## 2019-08-18 VITALS — BP 138/79 | HR 80 | Temp 98.0°F | Resp 16 | Ht 61.5 in | Wt 127.0 lb

## 2019-08-18 DIAGNOSIS — E782 Mixed hyperlipidemia: Secondary | ICD-10-CM

## 2019-08-18 DIAGNOSIS — R030 Elevated blood-pressure reading, without diagnosis of hypertension: Secondary | ICD-10-CM

## 2019-08-18 MED ORDER — ROSUVASTATIN CALCIUM 20 MG PO TABS: 10.0000 mg | ORAL_TABLET | Freq: Every day | ORAL | 2 refills | Status: DC

## 2019-08-18 NOTE — Progress Notes (Addendum)
Follow up visit  Subjective:   Renee Fuentes, female    DOB: 06/15/1955, 64 y.o.   MRN: [id number]   Chief Complaint  Patient presents with  . Hypertension  . Hyperlipidemia  . Follow-up    6 month    HPI  64 year old Asian [place] female with hyperlipidemia, GERD, here for follow-up of chest pain.  Patient has been doing well.  She has not had any recurrence of chest pain.  She walks up to 4 miles every day without any complaints.  Recently, she has reduced her physical activity during the winter.  Most recent LDL was 110 on rosuvastatin of 10 mg daily.    Current Outpatient Medications on File Prior to Visit  Medication Sig Dispense Refill  . aspirin EC 81 MG tablet Take 81 mg by mouth daily.    . calcium carbonate (OS-CAL) 600 MG TABS tablet Take 600 mg by mouth every other day.    . Cholecalciferol (D3-1000 PO) Take 1 capsule by mouth daily.    . Cyanocobalamin (VITAMIN B-12 PO) Take 1 tablet by mouth daily.    . Multiple Vitamin (MULTIVITAMIN WITH MINERALS) TABS tablet Take 1 tablet by mouth daily.    Marland Kitchen omeprazole (PRILOSEC) 40 MG capsule Take 40 mg by mouth daily as needed.    . propranolol (INDERAL) 20 MG tablet Take 1 tablet (20 mg total) by mouth 3 (three) times daily as needed (SBP > 140 mm Hg). (Patient not taking: Reported on 04/23/2019) 60 tablet 2  . rosuvastatin (CRESTOR) 10 MG tablet Take 10 mg by mouth daily.     No current facility-administered medications on file prior to visit.    Cardiovascular studies:  EKG 08/18/2019: Sinus rhythm 84 bpm.  Nonspecific T-abnormality.    EKG 02/13/2019: Sinus rhythm 83 bpm. Normal EKG.  Lexiscan Sestamibi stress test 03/16/2017: 1. SPECT images demonstrate normal homogeneous tracer distribution throughout the myocardium. 2. The calculated EF is at 80 %. Normal wall motion. 2 - 26mm horizontal ST segment depression in the inferolateral leads during stress and persisted in recovery. This is strongly positive  for ischemia. Changes reverted to normal in recovery at 2 minutes. 3.Low risk in view of normal perfusion and hyperdynamic LV systolic function, clinical correlataion highly recommended in view of abnormal EKG response with Lexiscan infusion.  Echocardiogram 04/03/2017: Left ventricle cavity is normal in size. Normal global wall motion. Normal diastolic filling pattern. Calculated EF 55%. Mild tricuspid regurgitation. Pulmonary artery systolic pressure is estimated at 30-35 mm Hg.   Cath- 2013[facility], [place]  No significant high-grade stenosis. 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 Ejection fraction 60% with mildly elevated LVEDP   Recent labs: 01/2019 LDL 88   Review of Systems  Cardiovascular: Negative for chest pain, dyspnea on exertion, leg swelling, palpitations and syncope.  Musculoskeletal: Positive for back pain.         Vitals:   08/18/19 1456  BP: 138/79  Pulse: 80  Resp: 16  Temp: 98 F (36.7 C)  SpO2: 98%    Body mass index is 23.61 kg/m. Filed Weights   08/18/19 1456  Weight: 127 lb (57.6 kg)     Objective:   Physical Exam  Constitutional: She appears well-developed and well-nourished.  Neck: No JVD present.  Cardiovascular: Normal rate, regular rhythm, normal heart sounds and intact distal pulses.  No murmur heard. Pulmonary/Chest: Effort normal and breath sounds normal. She has no wheezes. She has no rales.  Musculoskeletal:        General: No edema.  Nursing note and vitals reviewed.       Assessment & Recommendations:   64 year old Asian [place] female with hyperlipidemia, nonobstructive CAD, GERD, here for follow-up of chest pain.  Hyperlipidemia: Again with increasing dose of rosuvastatin 20 mg daily.  She has follow-up lipid panel in June with PCP.  If LDL remains greater than 80, increase Crestor to 40 mg daily.  Nonobstructive CAD: Conitnue As[irin 81 mg daily.  Follow-up in 6  months.   Elder Negus, MD [facility] [place] Pager: [phone] Office: [phone] If no answer Cell [phone]

## 2019-08-29 ENCOUNTER — Telehealth: Payer: Self-pay | Admitting: Cardiology

## 2019-08-29 DIAGNOSIS — R143 Flatulence: Secondary | ICD-10-CM

## 2019-08-29 NOTE — Telephone Encounter (Signed)
Patient called complaining of burning sensation since yesterday.  She denies any chest pain.  She has heavy sensation in her umbilical area.  Denies any vomiting, diarrhea, dyspnea, fever, chills.  This most likely is a gastric etiology.  However, I recommend that she get checked in an urgent care facility, where an EKG and physical exam can be performed.

## 2019-08-29 NOTE — Telephone Encounter (Signed)
From pt

## 2019-09-02 ENCOUNTER — Telehealth: Payer: Self-pay

## 2019-09-02 NOTE — Telephone Encounter (Signed)
Pt called to inform us that her LDL was recorded wrong and if you could corrected please thank you

## 2020-01-08 ENCOUNTER — Other Ambulatory Visit: Payer: Self-pay | Admitting: Internal Medicine

## 2020-01-08 DIAGNOSIS — Z1231 Encounter for screening mammogram for malignant neoplasm of breast: Secondary | ICD-10-CM

## 2020-02-11 ENCOUNTER — Ambulatory Visit: Payer: Medicare Other | Admitting: Cardiology

## 2020-02-11 ENCOUNTER — Encounter: Payer: Self-pay | Admitting: Cardiology

## 2020-02-11 VITALS — BP 142/79 | HR 69 | Resp 16 | Ht 61.5 in | Wt 128.0 lb

## 2020-02-11 DIAGNOSIS — E782 Mixed hyperlipidemia: Secondary | ICD-10-CM

## 2020-02-11 DIAGNOSIS — R030 Elevated blood-pressure reading, without diagnosis of hypertension: Secondary | ICD-10-CM

## 2020-02-11 MED ORDER — HYDRALAZINE HCL 25 MG PO TABS: 25.0000 mg | ORAL_TABLET | Freq: Three times a day (TID) | ORAL | 3 refills | Status: DC | PRN

## 2020-02-11 NOTE — Progress Notes (Signed)
Follow up visit  Subjective:   Renee Fuentes, female    DOB: Nov 01, 1955, 64 y.o.   MRN: [id number]   Chief Complaint  Patient presents with  . Hypertension    HPI  64 year old Asian [place] female with hyperlipidemia, GERD, now with elevated blood pressure.   Patient made urgent visit appt today due to elevated blood pressure. BP has ranged from 140-160/70-90 mmHg over last two days. She has noticed associate posterior headache. She has had longstanding epigastric and back pain after eating certain foods, but denies any exertional chest pain. She walks 3-4 miles everyday without any symptoms. She endorses stressors related to family issues. She has spent some sleepless nights lately, and also endorses not feeling interested in certain day to day activities, like praying.   Current Outpatient Medications on File Prior to Visit  Medication Sig Dispense Refill  . aspirin EC 81 MG tablet Take 81 mg by mouth daily.    . Cholecalciferol (D3-1000 PO) Take 1 capsule by mouth daily.    . Cyanocobalamin (VITAMIN B-12 PO) Take 1 tablet by mouth daily.    . Multiple Vitamin (MULTIVITAMIN WITH MINERALS) TABS tablet Take 1 tablet by mouth daily.    Marland Kitchen omeprazole (PRILOSEC) 40 MG capsule Take 40 mg by mouth daily as needed.    . rosuvastatin (CRESTOR) 20 MG tablet Take 0.5 tablets (10 mg total) by mouth daily. 90 tablet 2   No current facility-administered medications on file prior to visit.    Cardiovascular studies:  EKG 08/18/2019: Sinus rhythm 84 bpm.  Nonspecific T-abnormality.    EKG 02/13/2019: Sinus rhythm 83 bpm. Normal EKG.  Lexiscan Sestamibi stress test 03/16/2017: 1. SPECT images demonstrate normal homogeneous tracer distribution throughout the myocardium. 2. The calculated EF is at 80 %. Normal wall motion. 2 - 3mm horizontal ST segment depression in the inferolateral leads during stress and persisted in recovery. This is strongly positive for ischemia. Changes  reverted to normal in recovery at 2 minutes. 3.Low risk in view of normal perfusion and hyperdynamic LV systolic function, clinical correlataion highly recommended in view of abnormal EKG response with Lexiscan infusion.  Echocardiogram 04/03/2017: Left ventricle cavity is normal in size. Normal global wall motion. Normal diastolic filling pattern. Calculated EF 55%. Mild tricuspid regurgitation. Pulmonary artery systolic pressure is estimated at 30-35 mm Hg.   Cath- 2013[facility], [place]  No significant high-grade stenosis. 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 Ejection fraction 60% with mildly elevated LVEDP   Recent labs: 01/2019 LDL 88   Review of Systems  Cardiovascular: Negative for chest pain, dyspnea on exertion, leg swelling, palpitations and syncope.  Musculoskeletal: Positive for back pain.         Vitals:   02/11/20 0942  BP: (!) 142/79  Pulse: 69  Resp: 16  SpO2: 99%    Body mass index is 23.79 kg/m. Filed Weights   02/11/20 0942  Weight: 128 lb (58.1 kg)     Objective:   Physical Exam Vitals and nursing note reviewed.  Constitutional:      Appearance: She is well-developed.  Neck:     Vascular: No JVD.  Cardiovascular:     Rate and Rhythm: Normal rate and regular rhythm.     Pulses: Intact distal pulses.     Heart sounds: Normal heart sounds. No murmur heard.   Pulmonary:     Effort: Pulmonary effort is normal.     Breath sounds: Normal breath sounds. No  wheezing or rales.         Assessment & Recommendations:   64 year old Asian [place] female with hyperlipidemia, GERD, now with elevated blood pressure.   Elevated blood pressure without diagnosis of hypertension: I suspect this is related to stress. Encourage activities to cope with stress, such as meditation. She is reluctant to start scheduled antihypertensive therapy. Recommend hydralazine 25 mg tid prn.  Hyperlipidemia: Continue  Crestor.  Time spent: 30 min  F/u in 4 weeks  Renee Kamaka Emiliano Dyer, MD [facility]. [place] Pager: [phone] Office: [phone] If no answer Cell [phone]

## 2020-02-12 ENCOUNTER — Ambulatory Visit: Payer: Self-pay

## 2020-02-12 ENCOUNTER — Ambulatory Visit: Payer: Medicare Other | Admitting: Cardiology

## 2020-02-12 ENCOUNTER — Ambulatory Visit: Payer: Medicare Other | Admitting: Orthopedic Surgery

## 2020-02-12 DIAGNOSIS — M542 Cervicalgia: Secondary | ICD-10-CM | POA: Diagnosis not present

## 2020-02-12 DIAGNOSIS — M545 Low back pain, unspecified: Secondary | ICD-10-CM

## 2020-02-13 ENCOUNTER — Encounter: Payer: Self-pay | Admitting: Orthopedic Surgery

## 2020-02-13 NOTE — Progress Notes (Signed)
Office Visit Note   Patient: Renee Fuentes           Date of Birth: 1956/05/06           MRN: [id number] Visit Date: 02/12/2020 Requested by: Ralene Ok, MD [address] [place],  [place] [address] PCP: Ralene Ok, MD  Subjective: Chief Complaint  Patient presents with  . Neck - Pain  . Lower Back - Pain    HPI: Renee Fuentes is a 64 y.o. female who presents to the office complaining of neck and low back pain.  Patient notes pain over the last 2 to 3 weeks.  She denies any injury.  With her low back pain, she localizes it to the axial lumbar spine.  She notes occasional radiation into the anterior hip but denies any radiation down the leg.  Denies any numbness/tingling or weakness in her legs.  Denies any red flag symptoms such as bowel/bladder incontinence or saddle anesthesia.  Prolonged standing or sitting makes her pain worse.  She has had physical therapy at [facility] physical therapy in the past that is provided significant relief.  She was doing exercises as prescribed by them up until about November/December 2020.  Additionally she is complaining of neck pain.  Mostly midline and left-sided neck pain.  She denies any radicular pain down her arm or numbness/tingling in her arm.  She notes that the neck pain seems to travel up into the occiput.  Denies any nausea/vomiting/photophobia when she gets these occipital headaches.  Denies any weakness in the arm or decreased range of motion of the shoulder..                ROS: All systems reviewed are negative as they relate to the chief complaint within the history of present illness.  Patient denies fevers or chills.  Assessment & Plan: Visit Diagnoses:  1. Neck pain   2. Low back pain, unspecified back pain laterality, unspecified chronicity, unspecified whether sciatica present     Plan: Patient is a 64 year old female presents complaining of midline low back pain and cervical spine pain.  She has a history of  such pain.  No radicular pain or nerve root tension signs.  She has had physical therapy that is provided significant relief in the past.  She would like to avoid medications and injections if possible.  Radiographs are negative aside from some mild degenerative changes.  Plan to refer patient back to [facility] physical therapy.  She will work with the therapists and try and rehab her pain this way.  Follow-up in 6 weeks for clinical recheck.  If no improvement, consider MRI scan at that time.  Patient agreed with this plan.  Follow-Up Instructions: No follow-ups on file.   Orders:  Orders Placed This Encounter  Procedures  . XR Cervical Spine 2 or 3 views  . XR Lumbar Spine 2-3 Views   No orders of the defined types were placed in this encounter.     Procedures: No procedures performed   Clinical Data: No additional findings.  Objective: Vital Signs: There were no vitals taken for this visit.  Physical Exam:  Constitutional: Patient appears well-developed HEENT:  Head: Normocephalic Eyes:EOM are normal Neck: Normal range of motion Cardiovascular: Normal rate Pulmonary/chest: Effort normal Neurologic: Patient is alert Skin: Skin is warm Psychiatric: Patient has normal mood and affect  Ortho Exam: Ortho exam demonstrates left shoulder without crepitus.  Tenderness throughout the axial cervical spine and left-sided paraspinal musculature.  No tenderness in the right-sided paraspinal musculature.  Minor discomfort with active cervical range of motion.  Negative Spurling sign.  Mild to moderate tenderness throughout the axial lumbar spine.  Negative straight leg raise bilaterally.  No pain with internal rotation/external rotation of the bilateral hip joints.  No sensation loss throughout the bilateral lower extremity dermatomes.  5/5 motor strength of the bilateral hip flexors, quadriceps, hamstring, dorsiflexion, plantarflexion.  Excellent strength of the bilateral upper extremity  muscle groups including the rotator cuff.  Specialty Comments:  No specialty comments available.  Imaging: No results found.   PMFS History: Patient Active Problem List   Diagnosis Date Noted  . Elevated blood pressure reading in office without diagnosis of hypertension 02/13/2019  . Mixed hyperlipidemia 02/13/2019  . Benign labile hypertension 01/23/2019  . Hypercholesterolemia 01/23/2019  . Precordial pain 09/27/2012  . CAD (coronary artery disease), native coronary artery 09/27/2012   Past Medical History:  Diagnosis Date  . CAD (coronary artery disease)    a. 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  ETT-Myoview 10/09/12:  No ischemic ST changes on ECG, no ischemia, EF 68% (normal study)  . HLD (hyperlipidemia)     Family History  Problem Relation Age of Onset  . Hypotension Mother   . Healthy Father   . Breast cancer Sister   . Healthy Son   . Healthy Daughter   . Healthy Daughter     Past Surgical History:  Procedure Laterality Date  . ABDOMINAL HYSTERECTOMY    . CARDIAC CATHETERIZATION  2013   Performed in [facility], patient was told she had a stenosis but no PCI was performed.  . CHOLECYSTECTOMY     Social History   Occupational History  . Occupation: Self-employed  Tobacco Use  . Smoking status: Never Smoker  . Smokeless tobacco: Never Used  Vaping Use  . Vaping Use: Never used  Substance and Sexual Activity  . Alcohol use: No  . Drug use: No  . Sexual activity: Not on file

## 2020-02-19 ENCOUNTER — Ambulatory Visit: Payer: Medicare Other | Admitting: Cardiology

## 2020-03-03 ENCOUNTER — Other Ambulatory Visit: Payer: Self-pay

## 2020-03-03 ENCOUNTER — Ambulatory Visit
Admission: RE | Admit: 2020-03-03 | Discharge: 2020-03-03 | Disposition: A | Discharge status: Home / Self Care | Payer: Medicare Other | Source: Ambulatory Visit | Attending: Internal Medicine | Admitting: Internal Medicine

## 2020-03-03 DIAGNOSIS — Z1231 Encounter for screening mammogram for malignant neoplasm of breast: Secondary | ICD-10-CM

## 2020-03-14 NOTE — Progress Notes (Signed)
Follow up visit  Subjective:   Renee Fuentes, female    DOB: Nov 05, 1955, 64 y.o.   MRN: [id number]   Chief Complaint  Patient presents with  . Benign labile hypertension  . Coronary Artery Disease  . Follow-up    HPI  64 year old Asian [place] female with hyperlipidemia, GERD, now with elevated blood pressure.   Patient has not had any chest pain, headache symptoms. Blood pressure elevated today in the office, but has normalized on home BP checks.  OV 02/11/20: Patient made urgent visit appt today due to elevated blood pressure. BP has ranged from 140-160/70-90 mmHg over last two days. She has noticed associate posterior headache. She has had longstanding epigastric and back pain after eating certain foods, but denies any exertional chest pain. She walks 3-4 miles everyday without any symptoms. She endorses stressors related to family issues. She has spent some sleepless nights lately, and also endorses not feeling interested in certain day to day activities, like praying.   Current Outpatient Medications on File Prior to Visit  Medication Sig Dispense Refill  . aspirin EC 81 MG tablet Take 81 mg by mouth daily.    . Cholecalciferol (D3-1000 PO) Take 1 capsule by mouth daily.    . Cyanocobalamin (VITAMIN B-12 PO) Take 1 tablet by mouth daily.    . hydrALAZINE (APRESOLINE) 25 MG tablet Take 1 tablet (25 mg total) by mouth 3 (three) times daily as needed. 120 tablet 3  . Multiple Vitamin (MULTIVITAMIN WITH MINERALS) TABS tablet Take 1 tablet by mouth daily.    Marland Kitchen omeprazole (PRILOSEC) 40 MG capsule Take 40 mg by mouth daily as needed.    . rosuvastatin (CRESTOR) 20 MG tablet Take 0.5 tablets (10 mg total) by mouth daily. 90 tablet 2   No current facility-administered medications on file prior to visit.    Cardiovascular studies:  EKG 03/15/2020: Sinus rhythm 97 bpm  Low voltage in precordial leads Nonspecific ST depression   Lexiscan Sestamibi stress test  03/16/2017: 1. SPECT images demonstrate normal homogeneous tracer distribution throughout the myocardium. 2. The calculated EF is at 80 %. Normal wall motion. 2 - 38mm horizontal ST segment depression in the inferolateral leads during stress and persisted in recovery. This is strongly positive for ischemia. Changes reverted to normal in recovery at 2 minutes. 3.Low risk in view of normal perfusion and hyperdynamic LV systolic function, clinical correlataion highly recommended in view of abnormal EKG response with Lexiscan infusion.  Echocardiogram 04/03/2017: Left ventricle cavity is normal in size. Normal global wall motion. Normal diastolic filling pattern. Calculated EF 55%. Mild tricuspid regurgitation. Pulmonary artery systolic pressure is estimated at 30-35 mm Hg.   Cath- 2013[facility], [place]  No significant high-grade stenosis. 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 Ejection fraction 60% with mildly elevated LVEDP   Recent labs: 01/2019 LDL 88   Review of Systems  Cardiovascular: Negative for chest pain, dyspnea on exertion, leg swelling, palpitations and syncope.  Musculoskeletal: Positive for back pain.         Vitals:   03/15/20 1141  BP: (!) 143/86  Pulse: 91  Resp: 17  SpO2: 100%    Body mass index is 24.19 kg/m. Filed Weights   03/15/20 1141  Weight: 128 lb (58.1 kg)     Objective:   Physical Exam Vitals and nursing note reviewed.  Constitutional:      Appearance: She is well-developed.  Neck:     Vascular: No JVD.  Cardiovascular:     Rate and Rhythm: Normal rate and regular rhythm.     Pulses: Intact distal pulses.     Heart sounds: Normal heart sounds. No murmur heard.   Pulmonary:     Effort: Pulmonary effort is normal.     Breath sounds: Normal breath sounds. No wheezing or rales.         Assessment & Recommendations:   64 year old [place] female with hyperlipidemia, GERD, now with  elevated blood pressure.   Elevated blood pressure without diagnosis of hypertension: I suspect this is related to stress. Encourage activities to cope with stress, such as meditation. She is reluctant to start scheduled antihypertensive therapy. Recommend hydralazine 25 mg tid prn.  have not started any scheduled antihypertensive at this time.   Hyperlipidemia: Continue Crestor.  F/u in 6 months  Keijuan Schellhase Emiliano Dyer, MD [facility]. [place] Pager: [phone] Office: [phone] If no answer Cell [phone]

## 2020-03-15 ENCOUNTER — Encounter: Payer: Self-pay | Admitting: Cardiology

## 2020-03-15 ENCOUNTER — Ambulatory Visit: Payer: Medicare Other | Admitting: Cardiology

## 2020-03-15 ENCOUNTER — Other Ambulatory Visit: Payer: Self-pay

## 2020-03-15 VITALS — BP 143/86 | HR 91 | Resp 17 | Ht 61.0 in | Wt 128.0 lb

## 2020-03-15 DIAGNOSIS — R030 Elevated blood-pressure reading, without diagnosis of hypertension: Secondary | ICD-10-CM

## 2020-03-15 DIAGNOSIS — E782 Mixed hyperlipidemia: Secondary | ICD-10-CM

## 2020-03-25 ENCOUNTER — Ambulatory Visit: Payer: Medicare Other | Admitting: Orthopedic Surgery

## 2020-05-26 ENCOUNTER — Other Ambulatory Visit: Payer: Self-pay

## 2020-05-26 ENCOUNTER — Ambulatory Visit: Payer: Medicare Other | Admitting: Cardiology

## 2020-05-26 ENCOUNTER — Encounter: Payer: Self-pay | Admitting: Cardiology

## 2020-05-26 VITALS — BP 137/80 | HR 86 | Ht 61.0 in | Wt 127.0 lb

## 2020-05-26 DIAGNOSIS — R030 Elevated blood-pressure reading, without diagnosis of hypertension: Secondary | ICD-10-CM

## 2020-05-26 DIAGNOSIS — E782 Mixed hyperlipidemia: Secondary | ICD-10-CM

## 2020-05-26 NOTE — Progress Notes (Signed)
Follow up visit  Subjective:   Renee Fuentes, female    DOB: 1955/12/20, 64 y.o.   MRN: [id number]   Chief Complaint  Patient presents with  . Elevated blood pressure reading without diagnosis of hyperte  . Follow-up    HPI  64 year old [place] female with hyperlipidemia, GERD, with elevated blood pressure.   Patient caught COVID in 03/2020, fortunately had mild symptoms not requiring hospitalization. She has recovered, but still has some degree of exertional dyspnea. She denies any exertional chest pain. She has focal pain in left jaw.  Current Outpatient Medications on File Prior to Visit  Medication Sig Dispense Refill  . aspirin EC 81 MG tablet Take 81 mg by mouth daily.    . Cholecalciferol (D3-1000 PO) Take 1 capsule by mouth daily.    . Cyanocobalamin (VITAMIN B-12 PO) Take 1 tablet by mouth daily.    . hydrALAZINE (APRESOLINE) 25 MG tablet Take 1 tablet (25 mg total) by mouth 3 (three) times daily as needed. 120 tablet 3  . Multiple Vitamin (MULTIVITAMIN WITH MINERALS) TABS tablet Take 1 tablet by mouth daily.    Marland Kitchen omeprazole (PRILOSEC) 20 MG capsule Take 20 mg by mouth daily.    . rosuvastatin (CRESTOR) 20 MG tablet Take 0.5 tablets (10 mg total) by mouth daily. 90 tablet 2   No current facility-administered medications on file prior to visit.    Cardiovascular studies:  EKG 03/15/2020: Sinus rhythm 97 bpm  Low voltage in precordial leads Nonspecific ST depression   Lexiscan Sestamibi stress test 03/16/2017: 1. SPECT images demonstrate normal homogeneous tracer distribution throughout the myocardium. 2. The calculated EF is at 80 %. Normal wall motion. 2 - 77mm horizontal ST segment depression in the inferolateral leads during stress and persisted in recovery. This is strongly positive for ischemia. Changes reverted to normal in recovery at 2 minutes. 3.Low risk in view of normal perfusion and hyperdynamic LV systolic function, clinical correlataion  highly recommended in view of abnormal EKG response with Lexiscan infusion.  Echocardiogram 04/03/2017: Left ventricle cavity is normal in size. Normal global wall motion. Normal diastolic filling pattern. Calculated EF 55%. Mild tricuspid regurgitation. Pulmonary artery systolic pressure is estimated at 30-35 mm Hg.   Cath- 2013[facility] [place]  No significant high-grade stenosis. 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 Ejection fraction 60% with mildly elevated LVEDP   Recent labs: 01/2019 LDL 88   Review of Systems  Cardiovascular: Negative for chest pain, dyspnea on exertion, leg swelling, palpitations and syncope.  Musculoskeletal: Positive for back pain.        Vitals:   05/26/20 1035  BP: (!) 148/83  Pulse: 89  SpO2: 100%     Body mass index is 24 kg/m. Filed Weights   05/26/20 1035  Weight: 127 lb (57.6 kg)     Objective:   Physical Exam Vitals and nursing note reviewed.  Constitutional:      Appearance: She is well-developed.  Neck:     Vascular: No JVD.  Cardiovascular:     Rate and Rhythm: Normal rate and regular rhythm.     Pulses: Intact distal pulses.     Heart sounds: Normal heart sounds. No murmur heard.   Pulmonary:     Effort: Pulmonary effort is normal.     Breath sounds: Normal breath sounds. No wheezing or rales.         Assessment & Recommendations:   64 year old Asian [place] female with hyperlipidemia,  GERD, now with elevated blood pressure.   Elevated blood pressure without diagnosis of hypertension: Usually associated with stressful situations.Okay to use as needed hydralazine.  Some degree of exertional dyspnea is expected after COVID infection. If symptoms do not improve in 3 months, could consider echocardiogram.   Hyperlipidemia: Continue Crestor.  F/u in 3 months  Peityn Payton Emiliano Dyer, MD [facility]. [place] Pager: [phone] Office: [phone] If no  answer Cell [phone]

## 2020-05-27 ENCOUNTER — Encounter: Payer: Self-pay | Admitting: Cardiology

## 2020-06-22 ENCOUNTER — Other Ambulatory Visit: Payer: Self-pay

## 2020-06-22 ENCOUNTER — Ambulatory Visit (INDEPENDENT_AMBULATORY_CARE_PROVIDER_SITE_OTHER): Payer: Medicare Other | Admitting: Nurse Practitioner

## 2020-06-22 ENCOUNTER — Encounter: Payer: Self-pay | Admitting: Nurse Practitioner

## 2020-06-22 VITALS — BP 134/70 | HR 90 | Temp 97.6°F | Wt 128.0 lb

## 2020-06-22 DIAGNOSIS — R1013 Epigastric pain: Secondary | ICD-10-CM

## 2020-06-22 DIAGNOSIS — R0789 Other chest pain: Secondary | ICD-10-CM

## 2020-06-22 MED ORDER — SUCRALFATE 1 G PO TABS: 1.0000 g | ORAL_TABLET | Freq: Three times a day (TID) | ORAL | 0 refills | Status: DC

## 2020-06-22 NOTE — Progress Notes (Signed)
Subjective:  Patient ID: Renee Fuentes, female    DOB: 09-Oct-1955  Age: 65 y.o. MRN: [id number]  CC: Acute Visit (Pt c/o acid reflux that has flared up worse than normal x2 weeks. /)  Chest Pain  This is a recurrent problem. The current episode started 1 to 4 weeks ago. The onset quality is gradual. The problem occurs intermittently. The problem has been waxing and waning. The pain is present in the substernal region. The pain is severe. The quality of the pain is described as dull and tightness. The pain radiates to the epigastrium, left shoulder and mid back. Associated symptoms include abdominal pain. Pertinent negatives include no back pain, claudication, cough, diaphoresis, dizziness, exertional chest pressure, fever, headaches, hemoptysis, irregular heartbeat, leg pain, lower extremity edema, malaise/fatigue, nausea, numbness, palpitations, PND, shortness of breath, sputum production, syncope, vomiting or weakness. The pain is aggravated by food and emotional upset. She has tried antacids for the symptoms. The treatment provided mild relief. Risk factors include stress and post-menopausal.  Her past medical history is significant for anxiety/panic attacks, hyperlipidemia and hypertension.  Pertinent negatives for past medical history include no aortic aneurysm, no CAD, no diabetes, no MI, no PE, no strokes and no thyroid problem.  Her family medical history is significant for CAD and hypertension. Prior diagnostic workup includes chest x-ray, echocardiogram, exercise treadmill test, stress echo and cardiac catheterization.  cardiology: Dr. Theadore Nan Previous pcp: Dr. Ludwig Clarks GI: Dr. Loreta Ave EGD and colonoscopy completed per patient: diagnosed with GERD Minimal improvement with omeprazole 40mg  No melena or hematochezia. No tobacco or ETOH use.  Reviewed past Medical, Social and Family history today.  Outpatient Medications Prior to Visit  Medication Sig Dispense Refill  .  Cholecalciferol (D3-1000 PO) Take 1 capsule by mouth daily.    . Cyanocobalamin (VITAMIN B-12 PO) Take 1 tablet by mouth daily.    . Multiple Vitamin (MULTIVITAMIN WITH MINERALS) TABS tablet Take 1 tablet by mouth daily.    . rosuvastatin (CRESTOR) 20 MG tablet Take 0.5 tablets (10 mg total) by mouth daily. 90 tablet 2  . hydrALAZINE (APRESOLINE) 25 MG tablet Take 1 tablet (25 mg total) by mouth 3 (three) times daily as needed. 120 tablet 3   No facility-administered medications prior to visit.    ROS See HPI  Objective:  BP 134/70 (BP Location: Left Arm, Patient Position: Sitting, Cuff Size: Normal)   Pulse 90   Temp 97.6 F (36.4 C) (Temporal)   Wt 128 lb (58.1 kg)   SpO2 98%   BMI 24.19 kg/m   ECG: NSR, no change compared to previous ECG 03/2020  Physical Exam Vitals reviewed.  Cardiovascular:     Rate and Rhythm: Normal rate and regular rhythm.     Chest Wall: PMI is not displaced. No thrill.     Pulses: Normal pulses.     Heart sounds: Normal heart sounds.  Pulmonary:     Effort: Pulmonary effort is normal.     Breath sounds: Normal breath sounds.  Abdominal:     General: There is no distension.     Palpations: There is no mass.     Tenderness: There is abdominal tenderness in the epigastric area. There is no right CVA tenderness, left CVA tenderness, guarding or rebound.     Hernia: No hernia is present.  Musculoskeletal:     Right lower leg: No edema.     Left lower leg: No edema.  Skin:    General: Skin is warm and  dry.  Neurological:     Mental Status: She is alert and oriented to person, place, and time.     Assessment & Plan:  This visit occurred during the SARS-CoV-2 public health emergency.  Safety protocols were in place, including screening questions prior to the visit, additional usage of staff PPE, and extensive cleaning of exam room while observing appropriate contact time as indicated for disinfecting solutions.   Renee Fuentes was seen today for  acute visit.  Diagnoses and all orders for this visit:  Atypical chest pain -     EKG 12-Lead  Dyspepsia -     sucralfate (CARAFATE) 1 g tablet; Take 1 tablet (1 g total) by mouth 4 (four) times daily -  with meals and at bedtime.    Problem List Items Addressed This Visit   None   Visit Diagnoses    Atypical chest pain    -  Primary   Relevant Orders   EKG 12-Lead (Completed)   Dyspepsia       Relevant Medications   sucralfate (CARAFATE) 1 g tablet      Follow-up: Return in about 4 weeks (around 07/20/2020) for CPE (fasting).  Alysia Penna, NP

## 2020-06-22 NOTE — Patient Instructions (Signed)
Gastroesophageal Reflux Disease, Adult  Gastroesophageal reflux (GER) happens when acid from the stomach flows up into the tube that connects the mouth and the stomach (esophagus). Normally, food travels down the esophagus and stays in the stomach to be digested. With GER, food and stomach acid sometimes move back up into the esophagus. You may have a disease called gastroesophageal reflux disease (GERD) if the reflux:  Happens often.  Causes frequent or very bad symptoms.  Causes problems such as damage to the esophagus. When this happens, the esophagus becomes sore and swollen. Over time, GERD can make small holes (ulcers) in the lining of the esophagus. What are the causes? This condition is caused by a problem with the muscle between the esophagus and the stomach. When this muscle is weak or not normal, it does not close properly to keep food and acid from coming back up from the stomach. The muscle can be weak because of:  Tobacco use.  Pregnancy.  Having a certain type of hernia (hiatal hernia).  Alcohol use.  Certain foods and drinks, such as coffee, chocolate, onions, and peppermint. What increases the risk?  Being overweight.  Having a disease that affects your connective tissue.  Taking NSAIDs, such a ibuprofen. What are the signs or symptoms?  Heartburn.  Difficult or painful swallowing.  The feeling of having a lump in the throat.  A bitter taste in the mouth.  Bad breath.  Having a lot of saliva.  Having an upset or bloated stomach.  Burping.  Chest pain. Different conditions can cause chest pain. Make sure you see your doctor if you have chest pain.  Shortness of breath or wheezing.  A long-term cough or a cough at night.  Wearing away of the surface of teeth (tooth enamel).  Weight loss. How is this treated?  Making changes to your diet.  Taking medicine.  Having surgery. Treatment will depend on how bad your symptoms are. Follow  these instructions at home: Eating and drinking  Follow a diet as told by your doctor. You may need to avoid foods and drinks such as: ? Coffee and tea, with or without caffeine. ? Drinks that contain alcohol. ? Energy drinks and sports drinks. ? Bubbly (carbonated) drinks or sodas. ? Chocolate and cocoa. ? Peppermint and mint flavorings. ? Garlic and onions. ? Horseradish. ? Spicy and acidic foods. These include peppers, chili powder, curry powder, vinegar, hot sauces, and BBQ sauce. ? Citrus fruit juices and citrus fruits, such as oranges, lemons, and limes. ? Tomato-based foods. These include red sauce, chili, salsa, and pizza with red sauce. ? Fried and fatty foods. These include donuts, french fries, potato chips, and high-fat dressings. ? High-fat meats. These include hot dogs, rib eye steak, sausage, ham, and bacon. ? High-fat dairy items, such as whole milk, butter, and cream cheese.  Eat small meals often. Avoid eating large meals.  Avoid drinking large amounts of liquid with your meals.  Avoid eating meals during the 2-3 hours before bedtime.  Avoid lying down right after you eat.  Do not exercise right after you eat.   Lifestyle  Do not smoke or use any products that contain nicotine or tobacco. If you need help quitting, ask your doctor.  Try to lower your stress. If you need help doing this, ask your doctor.  If you are overweight, lose an amount of weight that is healthy for you. Ask your doctor about a safe weight loss goal.   General instructions  Pay attention to any changes in your symptoms.  Take over-the-counter and prescription medicines only as told by your doctor.  Do not take aspirin, ibuprofen, or other NSAIDs unless your doctor says it is okay.  Wear loose clothes. Do not wear anything tight around your waist.  Raise (elevate) the head of your bed about 6 inches (15 cm). You may need to use a wedge to do this.  Avoid bending over if this makes  your symptoms worse.  Keep all follow-up visits. Contact a doctor if:  You have new symptoms.  You lose weight and you do not know why.  You have trouble swallowing or it hurts to swallow.  You have wheezing or a cough that keeps happening.  You have a hoarse voice.  Your symptoms do not get better with treatment. Get help right away if:  You have sudden pain in your arms, neck, jaw, teeth, or back.  You suddenly feel sweaty, dizzy, or light-headed.  You have chest pain or shortness of breath.  You vomit and the vomit is green, yellow, or black, or it looks like blood or coffee grounds.  You faint.  Your poop (stool) is red, bloody, or black.  You cannot swallow, drink, or eat. These symptoms may represent a serious problem that is an emergency. Do not wait to see if the symptoms will go away. Get medical help right away. Call your local emergency services (911 in the [place] Do not drive yourself to the hospital. Summary  If a person has gastroesophageal reflux disease (GERD), food and stomach acid move back up into the esophagus and cause symptoms or problems such as damage to the esophagus.  Treatment will depend on how bad your symptoms are.  Follow a diet as told by your doctor.  Take all medicines only as told by your doctor. This information is not intended to replace advice given to you by your health care provider. Make sure you discuss any questions you have with your health care provider. Document Revised: 12/08/2019 Document Reviewed: 12/08/2019 Elsevier Patient Education  2021 Else[employer] Inc. Food Choices for Gastroesophageal Reflux Disease, Adult When you have gastroesophageal reflux disease (GERD), the foods you eat and your eating habits are very important. Choosing the right foods can help ease the discomfort of GERD. Consider working with a dietitian to help you make healthy food choices. What are tips for following this plan? Reading food labels  Look  for foods that are low in saturated fat. Foods that have less than 5% of daily value (DV) of fat and 0 g of trans fats may help with your symptoms. Cooking  Cook foods using methods other than frying. This may include baking, steaming, grilling, or broiling. These are all methods that do not need a lot of fat for cooking.  To add flavor, try to use herbs that are low in spice and acidity. Meal planning  Choose healthy foods that are low in fat, such as fruits, vegetables, whole grains, low-fat dairy products, lean meats, fish, and poultry.  Eat frequent, small meals instead of three large meals each day. Eat your meals slowly, in a relaxed setting. Avoid bending over or lying down until 2-3 hours after eating.  Limit high-fat foods such as fatty meats or fried foods.  Limit your intake of fatty foods, such as oils, butter, and shortening.  Avoid the following as told by your health care provider: ? Foods that cause symptoms. These may be different for different people.  Keep a food diary to keep track of foods that cause symptoms. ? Alcohol. ? Drinking large amounts of liquid with meals. ? Eating meals during the 2-3 hours before bed.   Lifestyle  Maintain a healthy weight. Ask your health care provider what weight is healthy for you. If you need to lose weight, work with your health care provider to do so safely.  Exercise for at least 30 minutes on 5 or more days each week, or as told by your health care provider.  Avoid wearing clothes that fit tightly around your waist and chest.  Do not use any products that contain nicotine or tobacco. These products include cigarettes, chewing tobacco, and vaping devices, such as e-cigarettes. If you need help quitting, ask your health care provider.  Sleep with the head of your bed raised. Use a wedge under the mattress or blocks under the bed frame to raise the head of the bed.  Chew sugar-free gum after mealtimes. What foods should I  eat? Eat a healthy, well-balanced diet of fruits, vegetables, whole grains, low-fat dairy products, lean meats, fish, and poultry. Each person is different. Foods that may trigger symptoms in one person may not trigger any symptoms in another person. Work with your health care provider to identify foods that are safe for you. The items listed above may not be a complete list of recommended foods and beverages. Contact a dietitian for more information.   What foods should I avoid? Limiting some of these foods may help manage the symptoms of GERD. Everyone is different. Consult a dietitian or your health care provider to help you identify the exact foods to avoid, if any. Fruits Any fruits prepared with added fat. Any fruits that cause symptoms. For some people this may include citrus fruits, such as oranges, grapefruit, pineapple, and lemons. Vegetables Deep-fried vegetables. [place] fries. Any vegetables prepared with added fat. Any vegetables that cause symptoms. For some people, this may include tomatoes and tomato products, chili peppers, onions and garlic, and horseradish. Grains Pastries or quick breads with added fat. Meats and other proteins High-fat meats, such as fatty beef or pork, hot dogs, ribs, ham, sausage, salami, and bacon. Fried meat or protein, including fried fish and fried chicken. Nuts and nut butters, in large amounts. Dairy Whole milk and chocolate milk. Sour cream. Cream. Ice cream. Cream cheese. Milkshakes. Fats and oils Butter. Margarine. Shortening. Ghee. Beverages Coffee and tea, with or without caffeine. Carbonated beverages. Sodas. Energy drinks. Fruit juice made with acidic fruits, such as orange or grapefruit. Tomato juice. Alcoholic drinks. Sweets and desserts Chocolate and cocoa. Donuts. Seasonings and condiments Pepper. Peppermint and spearmint. Added salt. Any condiments, herbs, or seasonings that cause symptoms. For some people, this may include curry, hot  sauce, or vinegar-based salad dressings. The items listed above may not be a complete list of foods and beverages to avoid. Contact a dietitian for more information. Questions to ask your health care provider Diet and lifestyle changes are usually the first steps that are taken to manage symptoms of GERD. If diet and lifestyle changes do not improve your symptoms, talk with your health care provider about taking medicines. Where to find more information  International Foundation for Gastrointestinal Disorders: aboutgerd.org Summary  When you have gastroesophageal reflux disease (GERD), food and lifestyle choices may be very helpful in easing the discomfort of GERD.  Eat frequent, small meals instead of three large meals each day. Eat your meals slowly, in a relaxed setting. Avoid  bending over or lying down until 2-3 hours after eating.  Limit high-fat foods such as fatty meats or fried foods. This information is not intended to replace advice given to you by your health care provider. Make sure you discuss any questions you have with your health care provider. Document Revised: 12/08/2019 Document Reviewed: 12/08/2019 Elsevier Patient Education  2021 [employer].

## 2020-08-09 ENCOUNTER — Ambulatory Visit: Payer: Medicare Other | Admitting: Family Medicine

## 2020-09-13 ENCOUNTER — Encounter: Payer: Self-pay | Admitting: Cardiology

## 2020-09-13 ENCOUNTER — Ambulatory Visit: Payer: Medicare Other | Admitting: Cardiology

## 2020-09-13 ENCOUNTER — Other Ambulatory Visit: Payer: Self-pay

## 2020-09-13 VITALS — BP 148/85 | HR 95 | Temp 98.0°F | Ht 61.0 in | Wt 127.0 lb

## 2020-09-13 DIAGNOSIS — E782 Mixed hyperlipidemia: Secondary | ICD-10-CM

## 2020-09-13 DIAGNOSIS — R072 Precordial pain: Secondary | ICD-10-CM

## 2020-09-13 DIAGNOSIS — R030 Elevated blood-pressure reading, without diagnosis of hypertension: Secondary | ICD-10-CM

## 2020-09-13 MED ORDER — ROSUVASTATIN CALCIUM 10 MG PO TABS: 10.0000 mg | ORAL_TABLET | Freq: Every day | ORAL | 3 refills | Status: DC

## 2020-09-13 NOTE — Progress Notes (Signed)
Follow up visit  Subjective:   Renee Fuentes, female    DOB: Feb 26, 1956, 65 y.o.   MRN: [id number]   Chief Complaint  Patient presents with  . Hypertension  . Follow-up  . Back Pain    HPI  65 year old Asian [place] female with hyperlipidemia, GERD, with elevated blood pressure.   Patient reports occasional episodes of abdominal pressure radiating to chest.  Her physical activity has been low during the winter and early spring months.  Blood pressure elevated today, but is very well controlled on home blood pressure log, ranging from 110-125/70-80 mmHg.  Reviewed recent lipid panel, details below.  Current Outpatient Medications on File Prior to Visit  Medication Sig Dispense Refill  . Cholecalciferol (D3-1000 PO) Take 1 capsule by mouth daily.    . Cyanocobalamin (VITAMIN B-12 PO) Take 1 tablet by mouth daily.    . hydrALAZINE (APRESOLINE) 25 MG tablet Take 1 tablet (25 mg total) by mouth 3 (three) times daily as needed. 120 tablet 3  . Multiple Vitamin (MULTIVITAMIN WITH MINERALS) TABS tablet Take 1 tablet by mouth daily.    Marland Kitchen omeprazole (PRILOSEC) 40 MG capsule Take 40 mg by mouth daily.    . rosuvastatin (CRESTOR) 20 MG tablet Take 0.5 tablets (10 mg total) by mouth daily. 90 tablet 2   No current facility-administered medications on file prior to visit.    Cardiovascular studies:  EKG 09/13/2020: Sinus rhythm 94 bpm  Nonspecific ST depression  -Nondiagnostic   EKG 03/15/2020: Sinus rhythm 97 bpm  Low voltage in precordial leads Nonspecific ST depression   Lexiscan Sestamibi stress test 03/16/2017: 1. SPECT images demonstrate normal homogeneous tracer distribution throughout the myocardium. 2. The calculated EF is at 80 %. Normal wall motion. 2 - 56mm horizontal ST segment depression in the inferolateral leads during stress and persisted in recovery. This is strongly positive for ischemia. Changes reverted to normal in recovery at 2 minutes. 3.Low risk in  view of normal perfusion and hyperdynamic LV systolic function, clinical correlataion highly recommended in view of abnormal EKG response with Lexiscan infusion.  Echocardiogram 04/03/2017: Left ventricle cavity is normal in size. Normal global wall motion. Normal diastolic filling pattern. Calculated EF 55%. Mild tricuspid regurgitation. Pulmonary artery systolic pressure is estimated at 30-35 mm Hg.   Cath- [facility] [place]  No significant high-grade stenosis. 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 Ejection fraction 60% with mildly elevated LVEDP   Recent labs: 2022: Glucose 181. Cholesterol 130, triglycerides 96, HDL 49, LDL 62  01/2019: LDL 88   Review of Systems  Cardiovascular: Negative for chest pain, dyspnea on exertion, leg swelling, palpitations and syncope.  Musculoskeletal: Positive for back pain.        Vitals:   09/13/20 1027 09/13/20 1035  BP: (!) 155/83 (!) 148/85  Pulse: (!) 108 95  Temp: 98 F (36.7 C)   SpO2: 100% 100%     Body mass index is 24 kg/m. Filed Weights   09/13/20 1027  Weight: 127 lb (57.6 kg)     Objective:   Physical Exam Vitals and nursing note reviewed.  Constitutional:      Appearance: She is well-developed.  Neck:     Vascular: No JVD.  Cardiovascular:     Rate and Rhythm: Normal rate and regular rhythm.     Pulses: Intact distal pulses.     Heart sounds: Normal heart sounds. No murmur heard.   Pulmonary:     Effort:  Pulmonary effort is normal.     Breath sounds: Normal breath sounds. No wheezing or rales.         Assessment & Recommendations:   65 year old Asian [place] female with hyperlipidemia, GERD, now with elevated blood pressure.   Elevated blood pressure without diagnosis of hypertension: Likely whitecoat hypertension.  Blood pressure very well controlled at home.    Atypical chest pain: Previous mild nonobstructive CAD on coronary angiogram in  2013. We will obtain exercise nuclear stress test.  Mixed hyperlipidemia: Controlled. Continue Crestor 10 mg daily.   F/u in 6 months  Koleen Celia Emiliano Dyer, MD [facility] Pager: [phone] Office: [phone] If no answer Cell [phone]

## 2020-09-15 ENCOUNTER — Telehealth: Payer: Self-pay | Admitting: Orthopedic Surgery

## 2020-09-15 NOTE — Telephone Encounter (Signed)
Pt called asking for another PT referral, if this won't be a problem she would like it sent to the same facility on [address]. Pt would like a CB as soon as this has been put in.    [phone]

## 2020-09-15 NOTE — Telephone Encounter (Signed)
Okay for this.  May need physical RX if she is referring to [facility] PT?

## 2020-09-15 NOTE — Telephone Encounter (Signed)
Please advise. Patient not seen since 02/2020

## 2020-09-16 NOTE — Telephone Encounter (Signed)
Faxed to [place] [facility] Tried calling patient to advise. No answer.

## 2020-09-18 ENCOUNTER — Encounter (HOSPITAL_COMMUNITY): Payer: Self-pay

## 2020-09-18 ENCOUNTER — Other Ambulatory Visit: Payer: Self-pay

## 2020-09-18 ENCOUNTER — Emergency Department (HOSPITAL_COMMUNITY): Payer: Medicare Other

## 2020-09-18 ENCOUNTER — Emergency Department (HOSPITAL_COMMUNITY)
Admission: EM | Admit: 2020-09-18 | Discharge: 2020-09-18 | Disposition: A | Discharge status: Home / Self Care | Payer: Medicare Other | Attending: Emergency Medicine | Admitting: Emergency Medicine

## 2020-09-18 DIAGNOSIS — R079 Chest pain, unspecified: Secondary | ICD-10-CM | POA: Diagnosis present

## 2020-09-18 DIAGNOSIS — I2510 Atherosclerotic heart disease of native coronary artery without angina pectoris: Secondary | ICD-10-CM | POA: Insufficient documentation

## 2020-09-18 LAB — CBC
HCT: 38.3 % (ref 36.0–46.0)
Hemoglobin: 12.8 g/dL (ref 12.0–15.0)
MCH: 30 pg (ref 26.0–34.0)
MCHC: 33.4 g/dL (ref 30.0–36.0)
MCV: 89.9 fL (ref 80.0–100.0)
Platelets: 237 10*3/uL (ref 150–400)
RBC: 4.26 MIL/uL (ref 3.87–5.11)
RDW: 12.5 % (ref 11.5–15.5)
WBC: 6.6 10*3/uL (ref 4.0–10.5)
nRBC: 0 % (ref 0.0–0.2)

## 2020-09-18 LAB — BASIC METABOLIC PANEL
Anion gap: 8 (ref 5–15)
BUN: 8 mg/dL (ref 8–23)
CO2: 25 mmol/L (ref 22–32)
Calcium: 9.4 mg/dL (ref 8.9–10.3)
Chloride: 105 mmol/L (ref 98–111)
Creatinine, Ser: 0.75 mg/dL (ref 0.44–1.00)
GFR, Estimated: >60 mL/min (ref >60)
Glucose, Bld: 109 mg/dL — ABNORMAL HIGH (ref 70–99)
Potassium: 3.4 mmol/L — ABNORMAL LOW (ref 3.5–5.1)
Sodium: 138 mmol/L (ref 135–145)

## 2020-09-18 LAB — TROPONIN I (HIGH SENSITIVITY)
Troponin I (High Sensitivity): 4 ng/L (ref <18)
Troponin I (High Sensitivity): 6 ng/L (ref <18)

## 2020-09-18 NOTE — ED Triage Notes (Signed)
Pt states that she is having left sided chest pain that started around 0200am. Describes it as sharp. Denies SOB, nausea, vomiting. Denies neck pain or jaw pain.

## 2020-09-18 NOTE — Discharge Instructions (Addendum)
Please follow-up with your cardiologist and your primary care doctor.  If you have any other episodes of chest pain or difficulty in breathing return to ER for reassessment.

## 2020-09-18 NOTE — ED Provider Notes (Signed)
[facility] EMERGENCY DEPARTMENT Provider Note   CSN: [id number] Arrival date & time: 09/18/20  0301     History Chief Complaint  Patient presents with  . Chest Pain    Renee Fuentes is a 65 y.o. female.  Presents to ER with concern for episode of chest pain.  Patient states pain started around 2 AM.  It lasted for a few minutes and then went away.  Pain was mild to moderate, sharp stabbing pain, left-sided.  Did not radiate.  No neck pain, jaw pain, arm pain.  No back pain.  This occurred at rest.  Not associated with exertion.  She does not frequently get episodes of chest pain.  She has no pain or symptoms at present.  Patient reports that she was recently evaluated in clinic for routine appointment by her cardiologist who recommended obtaining a stress test later this month for routine follow-up.  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   HPI     Past Medical History:  Diagnosis Date  . CAD (coronary artery disease)    a. 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  ETT-Myoview 10/09/12:  No ischemic ST changes on ECG, no ischemia, EF 68% (normal study)  . HLD (hyperlipidemia)     Patient Active Problem List   Diagnosis Date Noted  . Elevated blood pressure reading without diagnosis of hypertension 02/13/2019  . Mixed hyperlipidemia 02/13/2019  . Benign labile hypertension 01/23/2019  . Hypercholesterolemia 01/23/2019  . Precordial pain 09/27/2012  . CAD (coronary artery disease), native coronary artery 09/27/2012    Past Surgical History:  Procedure Laterality Date  . ABDOMINAL HYSTERECTOMY    . CARDIAC CATHETERIZATION  2013   Performed in [facility], patient was told she had a stenosis but no PCI was performed.   . CHOLECYSTECTOMY       OB History   No obstetric history on file.     Family History  Problem Relation Age of Onset  . Hypotension Mother   . Healthy Father   . Breast cancer Sister   . Healthy Son   . Healthy Daughter   . Healthy Daughter     Social History   Tobacco Use  . Smoking status: Never Smoker  . Smokeless tobacco: Never Used  Vaping Use  . Vaping Use: Never used  Substance Use Topics  . Alcohol use: No  . Drug use: No    Home Medications Prior to Admission medications   Medication Sig Start Date End Date Taking? Authorizing Provider  Cholecalciferol (D3-1000 PO) Take 1 capsule by mouth daily.    [provider]  Cyanocobalamin (VITAMIN B-12 PO) Take 1 tablet by mouth daily.    [provider]  hydrALAZINE (APRESOLINE) 25 MG tablet Take 1 tablet (25 mg total) by mouth 3 (three) times daily as needed. 02/11/20 05/11/20  Patwardhan, Anabel Bene, MD  Multiple Vitamin (MULTIVITAMIN WITH MINERALS) TABS tablet Take 1 tablet by mouth daily.    [provider]  omeprazole (PRILOSEC) 40 MG capsule Take 40 mg by mouth daily.    [provider]  rosuvastatin (CRESTOR) 10 MG tablet Take 1 tablet (10 mg total) by mouth daily. 09/13/20   Patwardhan, Anabel Bene, MD  Allergies    Iodine  Review of Systems   Review of Systems  Constitutional: Negative for chills and fever.  HENT: Negative for ear pain and sore throat.   Eyes: Negative for pain and visual disturbance.  Respiratory: Negative for cough and shortness of breath.   Cardiovascular: Negative for chest pain and palpitations.  Gastrointestinal: Negative for abdominal pain and vomiting.  Genitourinary: Negative for dysuria and hematuria.  Musculoskeletal: Negative for arthralgias and back pain.  Skin: Negative for color change and rash.  Neurological: Negative for seizures and syncope.  All other systems reviewed and are negative.   Physical Exam Updated Vital Signs BP 125/73    Pulse 65   Temp 98 F (36.7 C) (Oral)   Resp 16   Ht 5\' 2"  (1.575 m)   Wt 57.6 kg   SpO2 100%   BMI 23.23 kg/m   Physical Exam Vitals and nursing note reviewed.  Constitutional:      General: She is not in acute distress.    Appearance: She is well-developed.  HENT:     Head: Normocephalic and atraumatic.  Eyes:     Conjunctiva/sclera: Conjunctivae normal.  Cardiovascular:     Rate and Rhythm: Normal rate and regular rhythm.     Heart sounds: No murmur heard.   Pulmonary:     Effort: Pulmonary effort is normal. No respiratory distress.     Breath sounds: Normal breath sounds.  Abdominal:     Palpations: Abdomen is soft.     Tenderness: There is no abdominal tenderness.  Musculoskeletal:     Cervical back: Neck supple.  Skin:    General: Skin is warm and dry.  Neurological:     Mental Status: She is alert.     ED Results / Procedures / Treatments   Labs (all labs ordered are listed, but only abnormal results are displayed) Labs Reviewed  BASIC METABOLIC PANEL - Abnormal; Notable for the following components:      Result Value   Potassium 3.4 (*)    Glucose, Bld 109 (*)    All other components within normal limits  CBC  TROPONIN I (HIGH SENSITIVITY)  TROPONIN I (HIGH SENSITIVITY)    EKG EKG Interpretation  Date/Time:  Saturday September 18 2020 03:15:23 EDT Ventricular Rate:  95 PR Interval:  150 QRS Duration: 78 QT Interval:  346 QTC Calculation: 434 R Axis:   35 Text Interpretation: Normal sinus rhythm Nonspecific ST abnormality Abnormal ECG Confirmed by 08-15-1997 (Marianna Fuss) on 09/18/2020 4:39:06 AM   Radiology DG Chest 2 View  Result Date: 09/18/2020 CLINICAL DATA:  Chest pain EXAM: CHEST - 2 VIEW COMPARISON:  09/26/2012 FINDINGS: The heart size and mediastinal contours are within normal limits. Both lungs are clear. The visualized skeletal structures are unremarkable. IMPRESSION: No active cardiopulmonary disease. Electronically Signed   By:  09/28/2012 M.D.   On: 09/18/2020 03:42    Procedures Procedures   Medications Ordered in ED Medications - No data to display  ED Course  I have reviewed the triage vital signs and the nursing notes.  Pertinent labs & imaging results that were available during my care of the patient were reviewed by me and considered in my medical decision making (see chart for details).    MDM Rules/Calculators/A&P                          65 year old lady presents to ER after a brief episode of chest  pain occurring at rest at home.  On exam here she is well-appearing with completely normal vital signs.  She denies any ongoing symptoms.  Her EKG does not have any acute ischemic change and her troponin x2 is within normal limits.  Given her current clinical appearance and this work-up, I have very low suspicion for acute coronary syndrome at present.  The remainder of her basic labs were stable, chest x-ray negative.  Believe she can be managed in the outpatient setting, recommend she follow-up with her cardiologist and discharged home.    After the discussed management above, the patient was determined to be safe for discharge.  The patient was in agreement with this plan and all questions regarding their care were answered.  ED return precautions were discussed and the patient will return to the ED with any significant worsening of condition.    Final Clinical Impression(s) / ED Diagnoses Final diagnoses:  Chest pain, unspecified type    Rx / DC Orders ED Discharge Orders    None       Milagros Loll, MD 09/18/20 [phone]

## 2020-09-27 ENCOUNTER — Other Ambulatory Visit: Payer: Self-pay

## 2020-09-27 ENCOUNTER — Ambulatory Visit: Payer: Medicare Other

## 2020-09-27 DIAGNOSIS — R072 Precordial pain: Secondary | ICD-10-CM

## 2020-09-29 ENCOUNTER — Ambulatory Visit: Payer: Medicare Other | Admitting: Orthopedic Surgery

## 2020-10-11 ENCOUNTER — Ambulatory Visit: Payer: Medicare Other | Admitting: Orthopedic Surgery

## 2020-10-11 DIAGNOSIS — M545 Low back pain, unspecified: Secondary | ICD-10-CM | POA: Diagnosis not present

## 2020-10-11 DIAGNOSIS — M542 Cervicalgia: Secondary | ICD-10-CM

## 2020-10-12 ENCOUNTER — Encounter: Payer: Self-pay | Admitting: Orthopedic Surgery

## 2020-10-12 NOTE — Progress Notes (Signed)
Office Visit Note   Patient: Renee Fuentes           Date of Birth: 11/26/1955           MRN: [id number] Visit Date: 10/11/2020 Requested by: Ralene Ok, MD [id number] [place],  [place] [address] PCP: Ralene Ok, MD  Subjective: Chief Complaint  Patient presents with  . Lower Back - Pain    HPI: Patient presents for evaluation of upper back pain and mild low back pain.  She was working out in the garden feels like she pulled a muscle between her shoulder blades.  The pain is worse in the morning.  She has been using heating pad and taking Tylenol.  Had previous problem with this several years ago and did physical therapy in the year 2020 along with home exercises which resolves her symptoms.  She also has been doing occasional acupuncture.  Denies any red flag symptoms such as saddle paresthesias or fevers and chills.              ROS: All systems reviewed are negative as they relate to the chief complaint within the history of present illness.  Patient denies  fevers or chills.   Assessment & Plan: Visit Diagnoses: No diagnosis found.  Plan: Impression is upper and lower back strain following overexertion in the garden.  Overall the patient has very good tissue quality on exam.  No nerve root tension signs today in the lower extremity no scapular dyskinesia in the shoulder blade/scapular region.  Artelia Laroche try her again with some physical therapy to see if that helps and if not we could consider further imaging and injections.  She wants to hold off on anti-inflammatories for now.  She will stick with Tylenol.  Follow-Up Instructions: Return if symptoms worsen or fail to improve.   Orders:  No orders of the defined types were placed in this encounter.  No orders of the defined types were placed in this encounter.     Procedures: No procedures performed   Clinical Data: No additional findings.  Objective: Vital Signs: There were no vitals taken for this  visit.  Physical Exam:   Constitutional: Patient appears well-developed HEENT:  Head: Normocephalic Eyes:EOM are normal Neck: Normal range of motion Cardiovascular: Normal rate Pulmonary/chest: Effort normal Neurologic: Patient is alert Skin: Skin is warm Psychiatric: Patient has normal mood and affect    Ortho Exam: Ortho exam demonstrates full active and passive range of motion of the hips ankles and knees.  No nerve root tension signs.  Patient has 5 out of 5 ankle dorsiflexion plantarflexion quad hamstring strength with palpable pedal pulses.  Minimal pain with forward lateral bending.  No masses lymphadenopathy or skin changes noted in that back region.  Full circumduction and movement of the arms with no scapular dyskinesia.  Patient has mild pain with forward flexion but no scoliosis no rashes on the back.  No real tenderness to palpation anywhere along the lumbar spine.  She has mild tenderness to palpation around T6 region spinous process.  Specialty Comments:  No specialty comments available.  Imaging: No results found.   PMFS History: Patient Active Problem List   Diagnosis Date Noted  . Elevated blood pressure reading without diagnosis of hypertension 02/13/2019  . Mixed hyperlipidemia 02/13/2019  . Benign labile hypertension 01/23/2019  . Hypercholesterolemia 01/23/2019  . Precordial pain 09/27/2012  . CAD (coronary artery disease), native coronary artery 09/27/2012   Past Medical History:  Diagnosis Date  .  CAD (coronary artery disease)    a. 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  ETT-Myoview 10/09/12:  No ischemic ST changes on ECG, no ischemia, EF 68% (normal study)  . HLD (hyperlipidemia)     Family History  Problem Relation Age of Onset  . Hypotension Mother   . Healthy Father   . Breast cancer Sister   . Healthy Son    . Healthy Daughter   . Healthy Daughter     Past Surgical History:  Procedure Laterality Date  . ABDOMINAL HYSTERECTOMY    . CARDIAC CATHETERIZATION  2013   Performed in [facility], patient was told she had a stenosis but no PCI was performed.  . CHOLECYSTECTOMY     Social History   Occupational History  . Occupation: Self-employed  Tobacco Use  . Smoking status: Never Smoker  . Smokeless tobacco: Never Used  Vaping Use  . Vaping Use: Never used  Substance and Sexual Activity  . Alcohol use: No  . Drug use: No  . Sexual activity: Not on file

## 2020-12-20 ENCOUNTER — Other Ambulatory Visit: Payer: Self-pay | Admitting: Internal Medicine

## 2020-12-20 DIAGNOSIS — Z1231 Encounter for screening mammogram for malignant neoplasm of breast: Secondary | ICD-10-CM

## 2021-03-07 ENCOUNTER — Ambulatory Visit
Admission: RE | Admit: 2021-03-07 | Discharge: 2021-03-07 | Disposition: A | Discharge status: Home / Self Care | Payer: Medicare Other | Source: Ambulatory Visit | Attending: Internal Medicine | Admitting: Internal Medicine

## 2021-03-07 ENCOUNTER — Other Ambulatory Visit: Payer: Self-pay

## 2021-03-07 DIAGNOSIS — Z1231 Encounter for screening mammogram for malignant neoplasm of breast: Secondary | ICD-10-CM

## 2021-03-14 ENCOUNTER — Encounter: Payer: Self-pay | Admitting: Cardiology

## 2021-03-14 ENCOUNTER — Ambulatory Visit: Payer: Medicare Other | Admitting: Cardiology

## 2021-03-14 ENCOUNTER — Other Ambulatory Visit: Payer: Self-pay

## 2021-03-14 VITALS — BP 134/83 | HR 91 | Temp 97.7°F | Ht 62.0 in | Wt 125.8 lb

## 2021-03-14 DIAGNOSIS — R072 Precordial pain: Secondary | ICD-10-CM

## 2021-03-14 DIAGNOSIS — E782 Mixed hyperlipidemia: Secondary | ICD-10-CM

## 2021-03-14 MED ORDER — ROSUVASTATIN CALCIUM 10 MG PO TABS: 10.0000 mg | ORAL_TABLET | ORAL | 0 refills | Status: DC

## 2021-03-14 NOTE — Progress Notes (Signed)
Follow up visit  Subjective:   Renee Fuentes, female    DOB: Nov 06, 1955, 65 y.o.   MRN: [id number]   Chief Complaint  Patient presents with   Hyperlipidemia   Follow-up    HPI  65 year old Asian [place] female with hyperlipidemia, GERD, with elevated blood pressure.  Patient is doing well. She has occasionally left and right upper chest/shoulder pain, sometime under her left breast. None of these episodes are related to exertion. She has been doing well with participating in "[place] cultural dance every day without any difficulty. She is leaving for [place] in a few days. She wonders if she can reduce/stop her statin.    Current Outpatient Medications on File Prior to Visit  Medication Sig Dispense Refill   Cholecalciferol (D3-1000 PO) Take 1 capsule by mouth daily.     Cyanocobalamin (VITAMIN B-12 PO) Take 1 tablet by mouth daily.     Multiple Vitamin (MULTIVITAMIN WITH MINERALS) TABS tablet Take 1 tablet by mouth daily.     omeprazole (PRILOSEC) 40 MG capsule Take 40 mg by mouth daily.     rosuvastatin (CRESTOR) 10 MG tablet Take 1 tablet (10 mg total) by mouth daily. 90 tablet 3   No current facility-administered medications on file prior to visit.    Cardiovascular studies:  EKG 03/14/2021: Sinus rhythm 81 bpm  Normal EKG  Lexiscan (Walking with mod Bruce)Tetrofosmin Stress Test  09/27/2020: Nondiagnostic ECG stress. The patient was injected with 0.4 mg of Intravenous Lexiscan over 15 sec. Additionally, patient exercised on a Modified Bruce protocol. Peak EKG/ECG demonstrated normal sinus rhythm. 1 mm upsloping ST depression present. Recovery EKG/ECG revealed non-specific T-wave abnormalities. Myocardial perfusion is normal. Overall LV systolic function is normal without regional wall motion abnormalities. Stress LV EF: 74%.  No previous exam available for comparison. Low risk study.  Echocardiogram 04/03/2017: Left ventricle cavity is normal  in size. Normal global wall motion. Normal diastolic filling pattern. Calculated EF 55%. Mild tricuspid regurgitation. Pulmonary artery systolic pressure is estimated at 30-35 mm Hg.    Cath- [facility] [place]  No significant high-grade stenosis. 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 Ejection fraction 60% with mildly elevated LVEDP   Recent labs: 2022: Glucose 181. Cholesterol 130, triglycerides 96, HDL 49, LDL 62  01/2019: LDL 88   Review of Systems  Cardiovascular:  Negative for chest pain, dyspnea on exertion, leg swelling, palpitations and syncope.  Musculoskeletal:  Positive for back pain.       Vitals:   03/14/21 1205  BP: (!) 150/81  Pulse: 94  Temp: 97.7 F (36.5 C)  SpO2: 100%     Body mass index is 23.01 kg/m. Filed Weights   03/14/21 1205  Weight: 125 lb 12.8 oz (57.1 kg)     Objective:   Physical Exam Vitals and nursing note reviewed.  Constitutional:      Appearance: She is well-developed.  Neck:     Vascular: No JVD.  Cardiovascular:     Rate and Rhythm: Normal rate and regular rhythm.     Pulses: Intact distal pulses.     Heart sounds: Normal heart sounds. No murmur heard. Pulmonary:     Effort: Pulmonary effort is normal.     Breath sounds: Normal breath sounds. No wheezing or rales.        Assessment & Recommendations:   65 year old Asian [place] female with hyperlipidemia, GERD, now with elevated blood pressure.   Elevated blood pressure without diagnosis  of hypertension: Better on second check.  Atypical chest pain: Previous mild nonobstructive CAD on coronary angiogram in 2013. No ischemic on stress testing (09/2020) Likely musculoskeletal.  Mixed hyperlipidemia: Controlled. Okay to take Crestor 10 mg every other day.  F/u in 6 months  Kimberlly Norgard Emiliano Dyer, MD [facility] [place] Pager: [phone] Office: [phone] If no answer Cell [phone]

## 2021-03-17 ENCOUNTER — Ambulatory Visit: Payer: Medicare Other | Admitting: Cardiology

## 2021-04-20 IMAGING — US US PELVIS COMPLETE WITH TRANSVAGINAL
1 series · 14 of 25 positions shown · non-contrast
Comparison: Pelvic ultrasound dated 01/30/2016.

CLINICAL DATA: Bloating.  A history of hysterectomy.

EXAM:
TRANSABDOMINAL AND TRANSVAGINAL ULTRASOUND OF PELVIS
TECHNIQUE: Both transabdominal and transvaginal ultrasound examinations of the
pelvis were performed. Transabdominal technique was performed for
global imaging of the pelvis including uterus, ovaries, adnexal
regions, and pelvic cul-de-sac. It was necessary to proceed with
endovaginal exam following the transabdominal exam to visualize the
adnexal regions.

[Series 1: us pelvis complete with transvaginal · 0.23mm/px · 37 acquisitions, 14 frames shown]
[im 1/37]
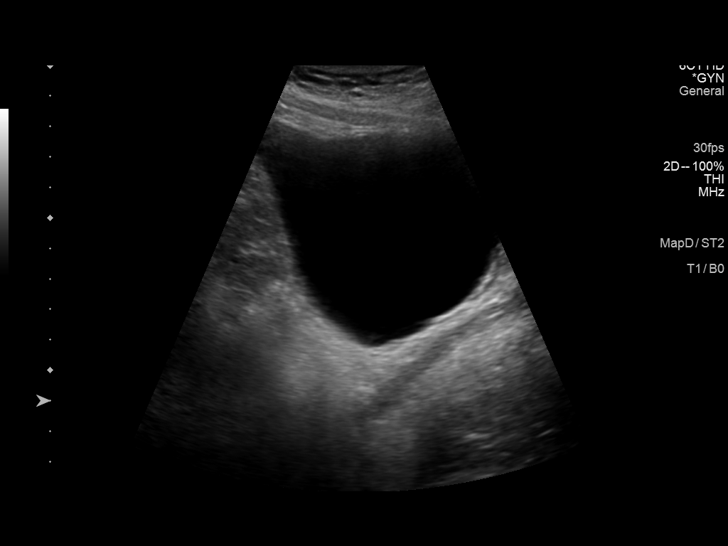
[im 4/37]
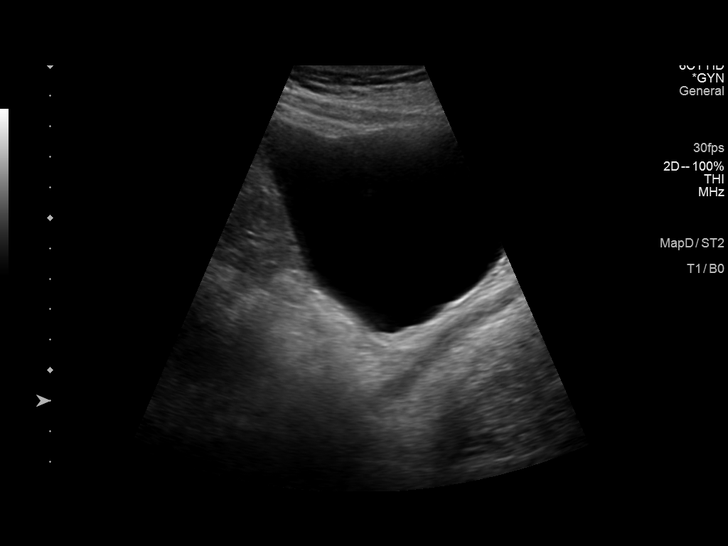
[im 7/37]
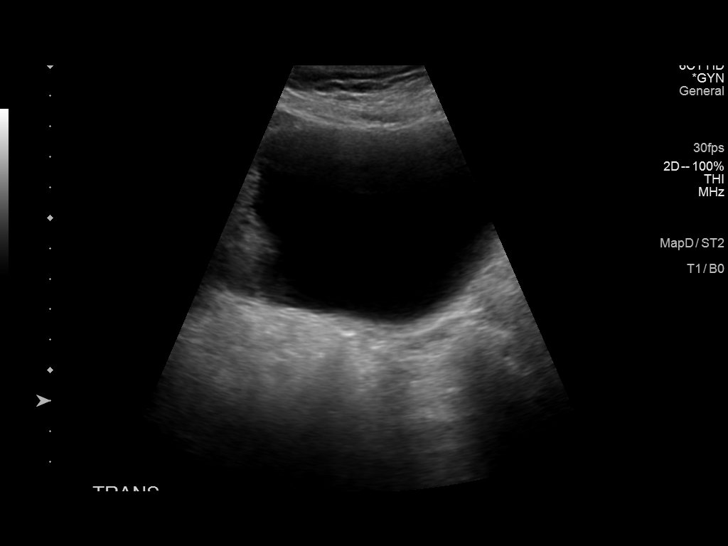
[im 10/37]
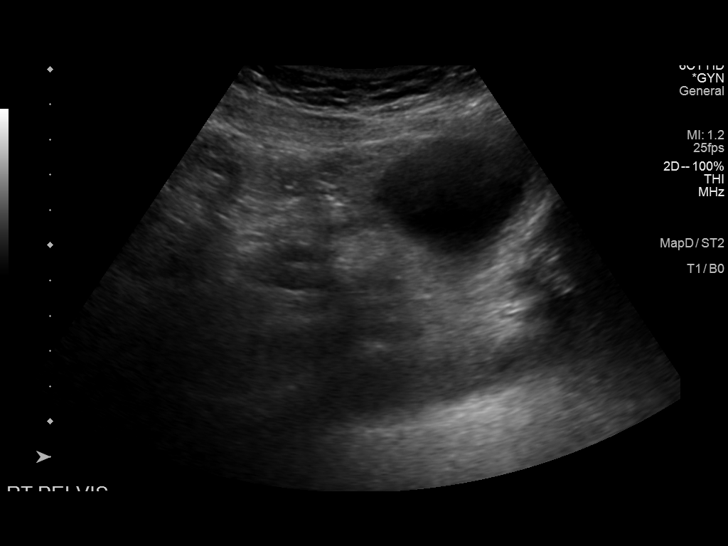
[im 13/37]
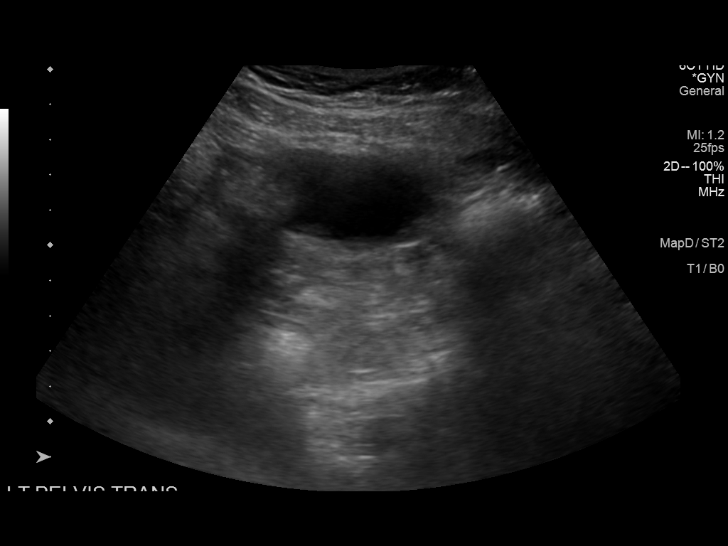
[im 14/37]
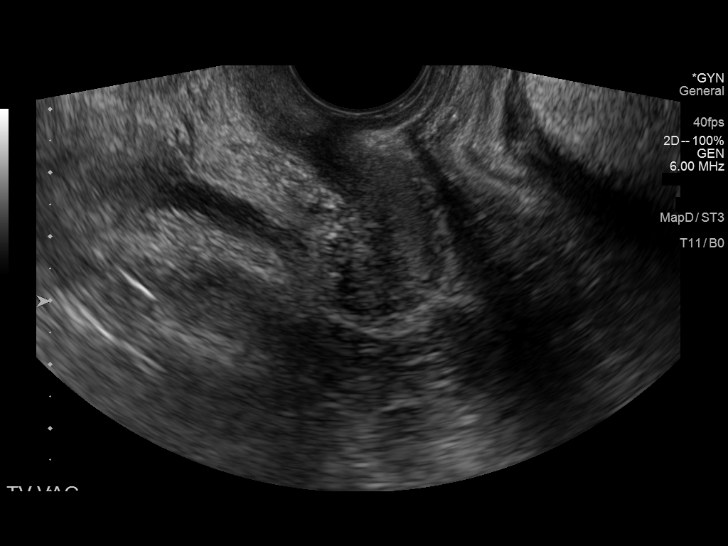
[im 17/37]
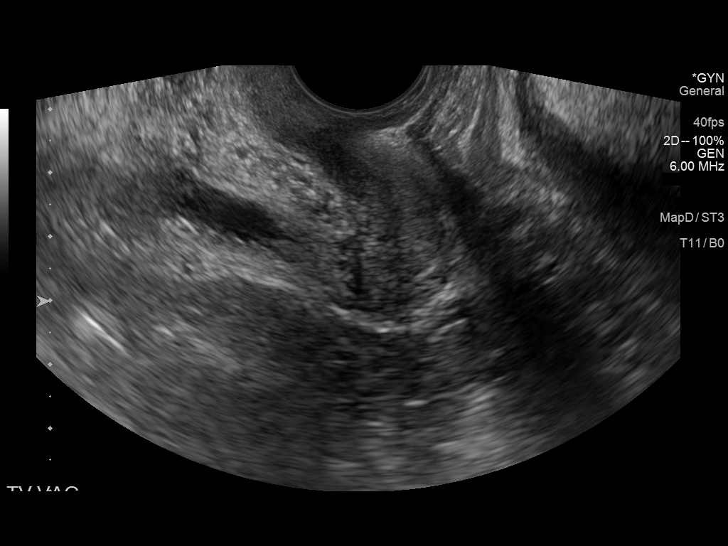
[im 20/37]
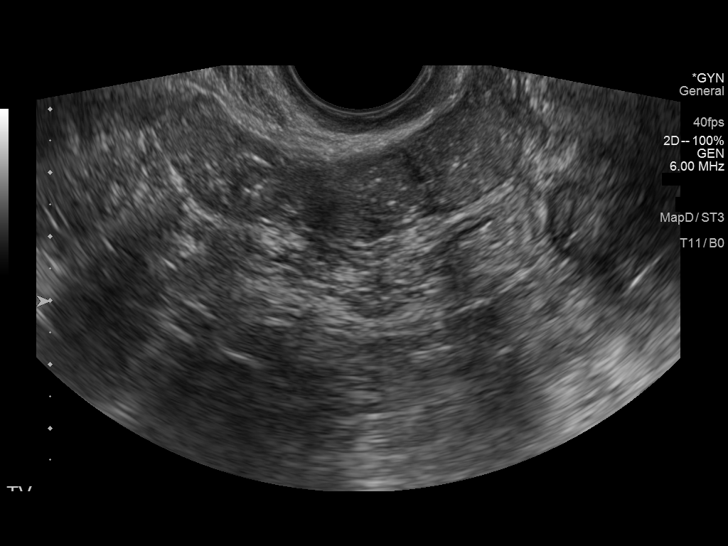
[im 23/37]
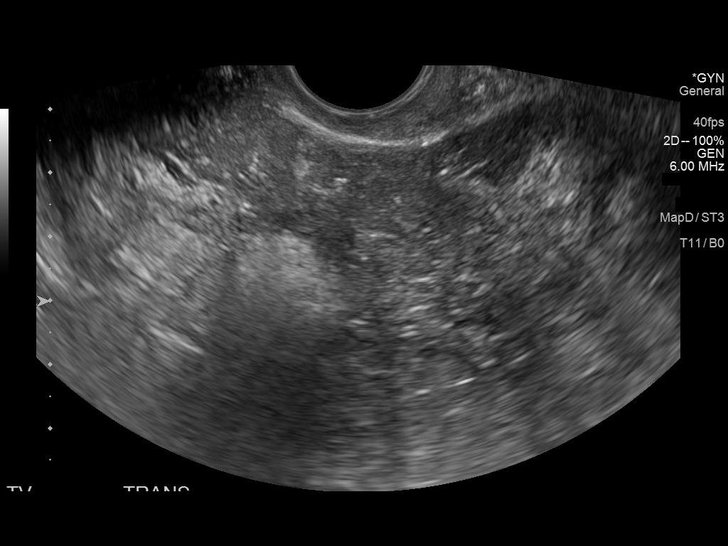
[im 25/37]
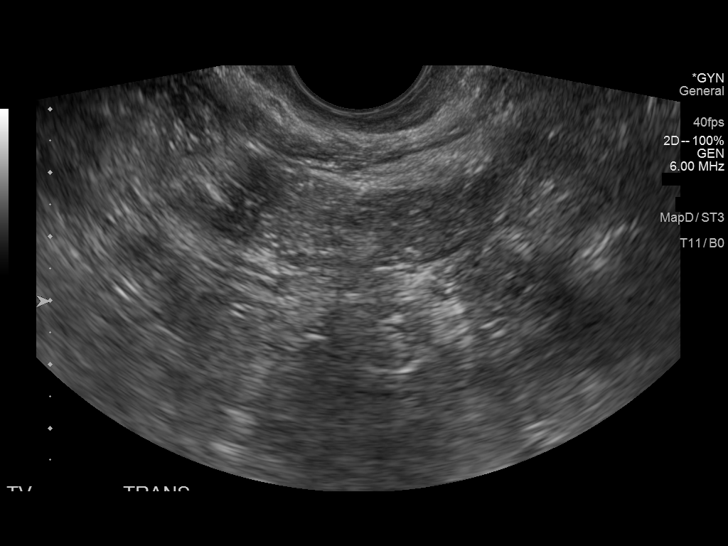
[im 28/37]
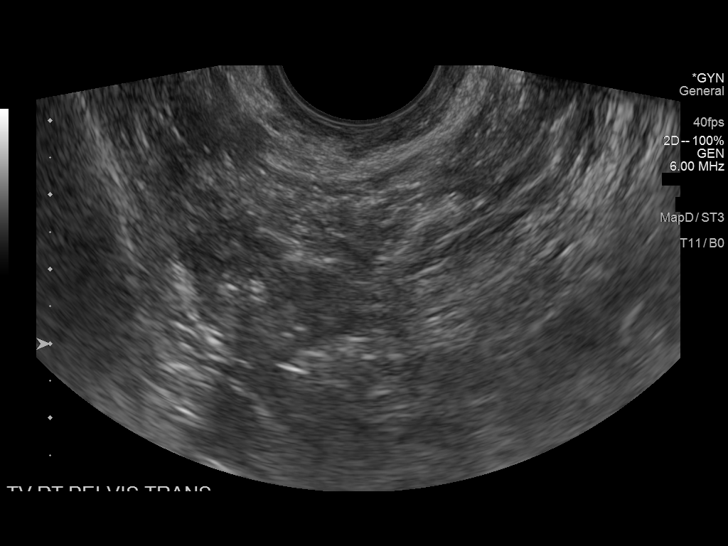
[im 31/37]
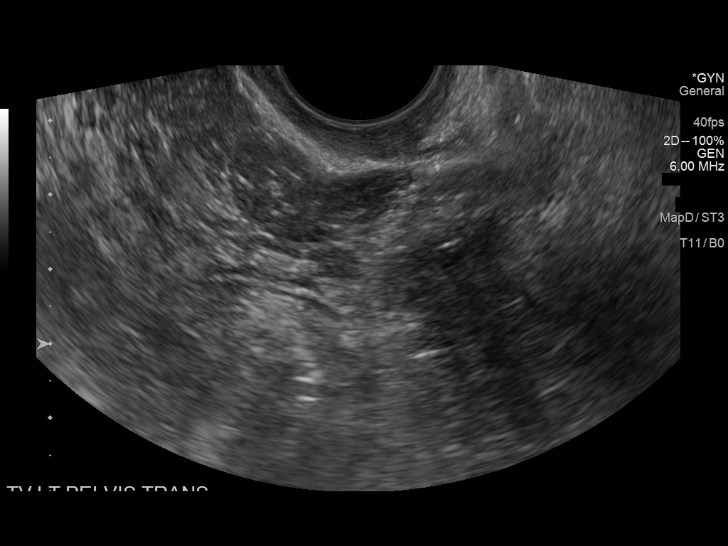
[im 34/37]
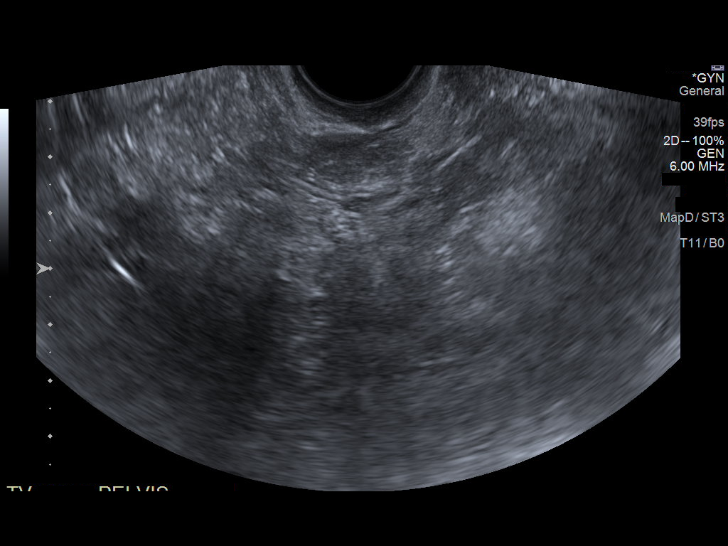
[im 37/37]
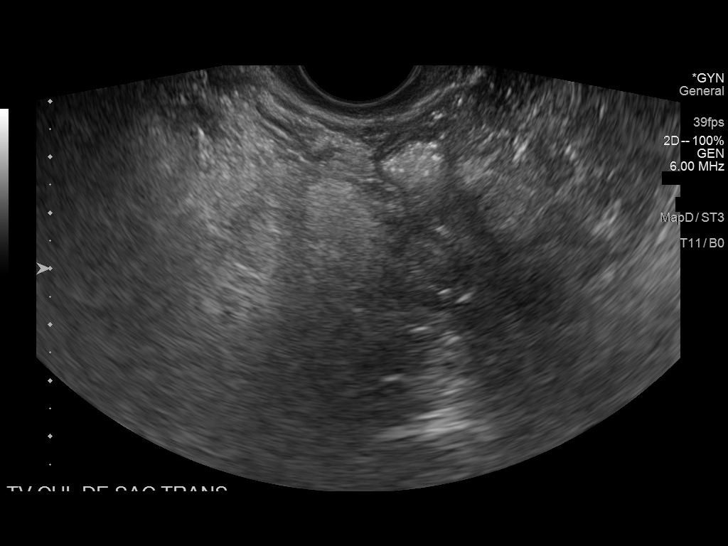

[14 of 25 positions shown; findings below may reference images not displayed]

FINDINGS: Uterus

Surgically absent.

Endometrium

Surgically absent.

Right ovary

Not visualized on transabdominal or endovaginal scanning.

Left ovary

Not visualized on transabdominal or endovaginal scanning.

Other findings

No abnormal free fluid.
IMPRESSION: Postoperative appearance of hysterectomy. The ovaries are not
definitely visualized.

## 2021-07-11 ENCOUNTER — Other Ambulatory Visit: Payer: Self-pay | Admitting: Cardiology

## 2021-07-11 DIAGNOSIS — E782 Mixed hyperlipidemia: Secondary | ICD-10-CM

## 2021-12-13 IMAGING — MG DIGITAL SCREENING BILAT W/ TOMO W/ CAD
6 series · 6 of 22 positions shown · non-contrast
Comparison: Previous exam(s).

CLINICAL DATA: Screening.

EXAM:
DIGITAL SCREENING BILATERAL MAMMOGRAM WITH TOMO AND CAD

[R MLO synth-2D]
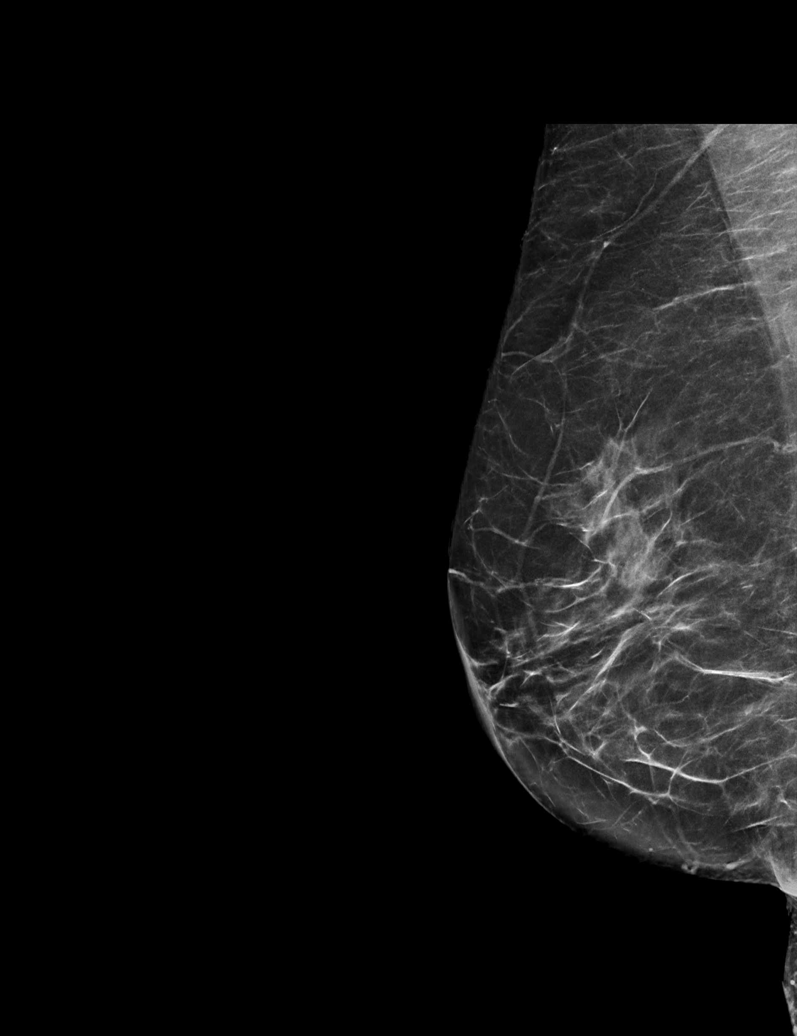

[R CC synth-2D]
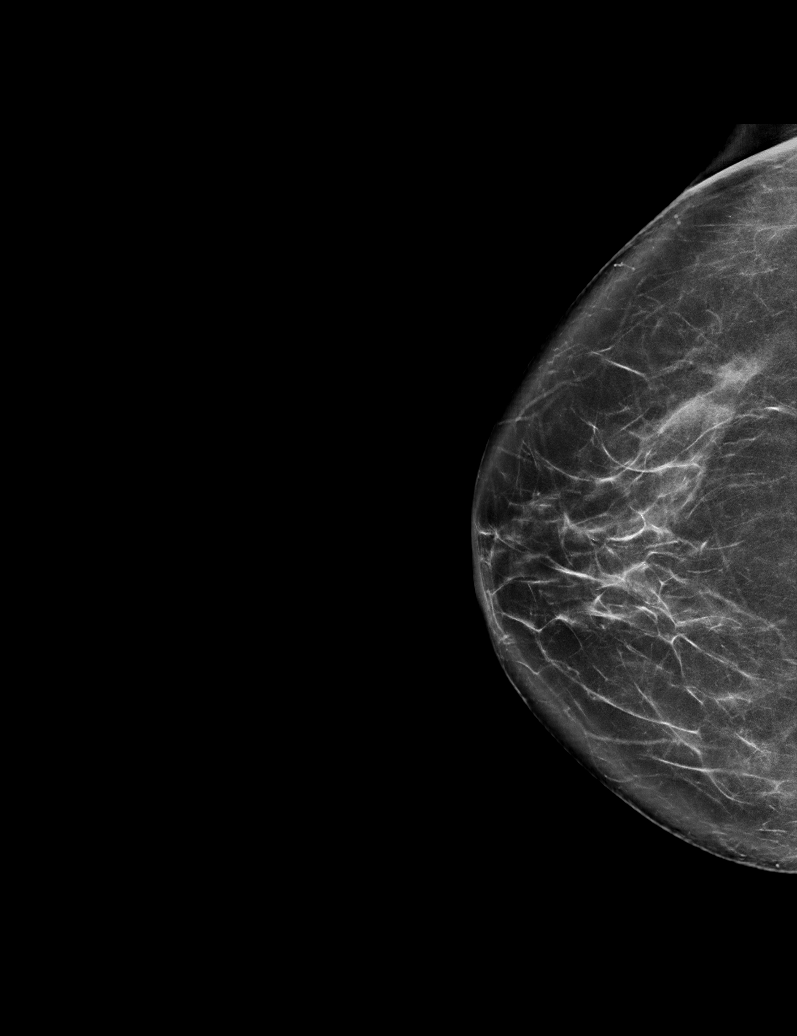

[R CC tomo · tomo slice 35/70.0]
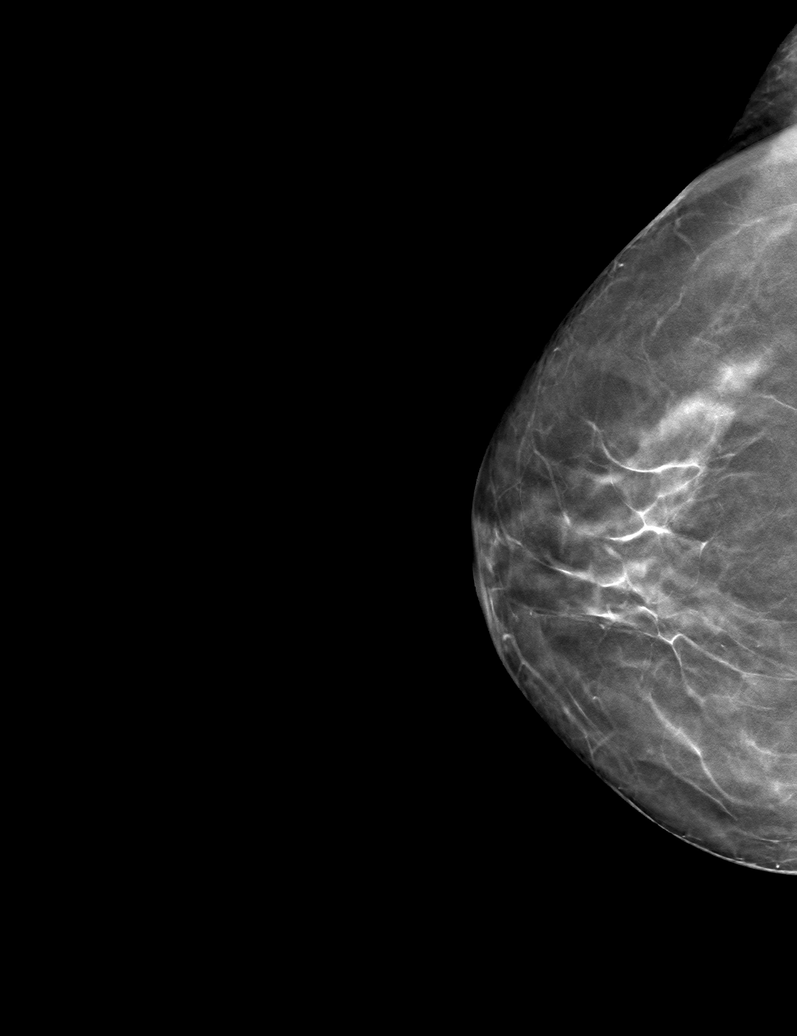

[R MLO tomo · tomo slice 34/67.0]
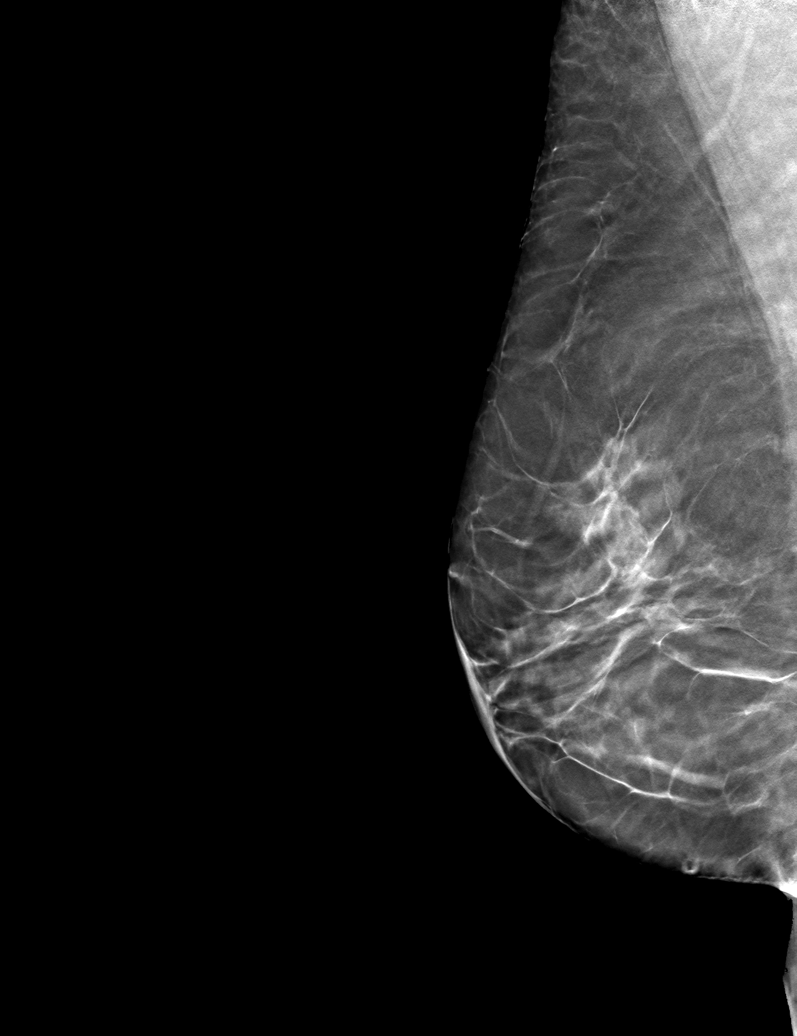

[L CC tomo · tomo slice 33/65.0]
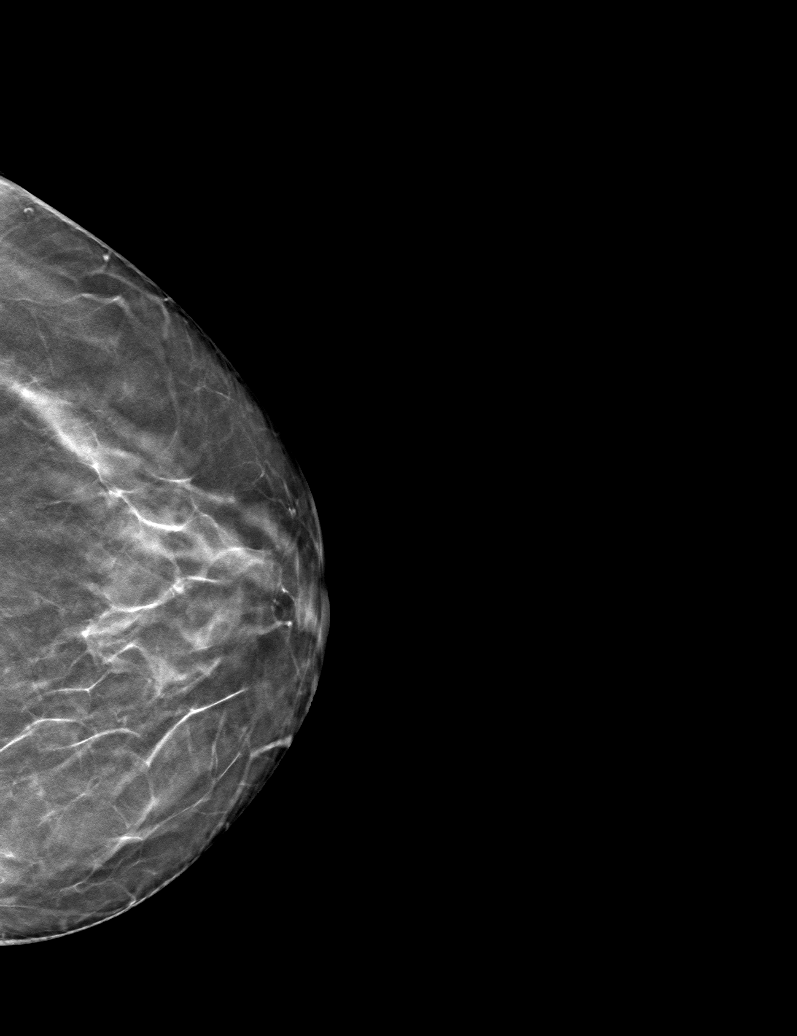

[L MLO tomo · tomo slice 33/66.0]
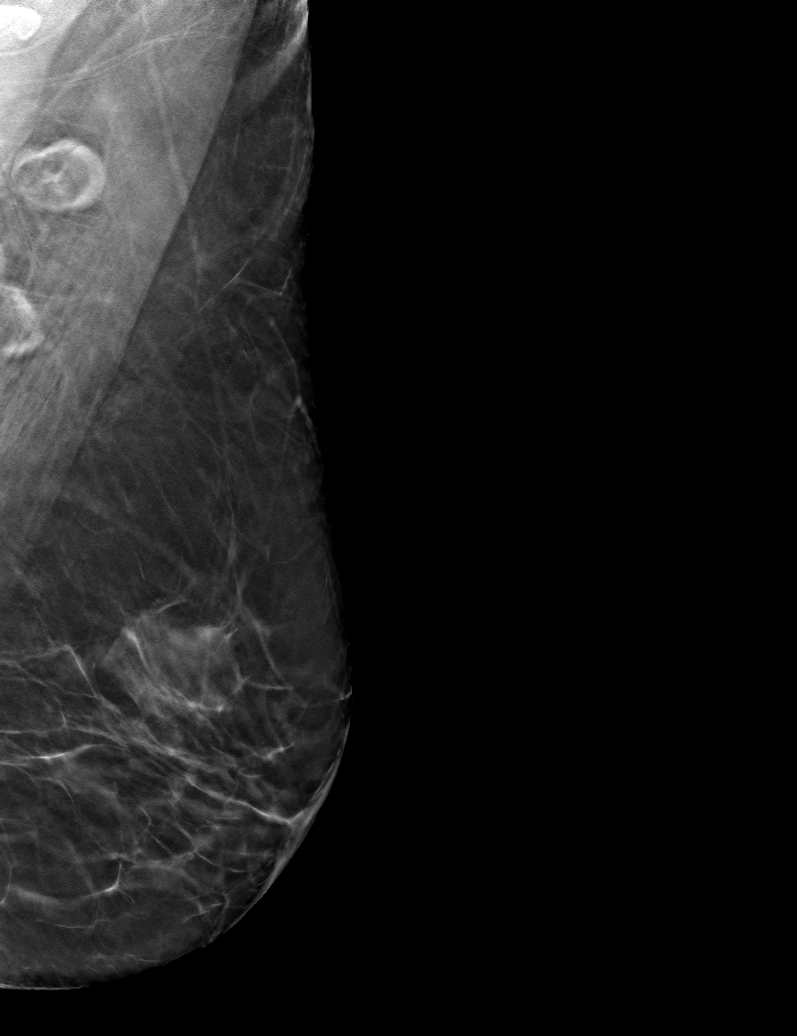

[6 of 22 positions shown; findings below may reference images not displayed]

ACR Breast Density Category b: There are scattered areas of
fibroglandular density.
FINDINGS: There are no findings suspicious for malignancy. Images were
processed with CAD.
IMPRESSION: No mammographic evidence of malignancy. A result letter of this
screening mammogram will be mailed directly to the patient.

RECOMMENDATION:
Screening mammogram in one year. (Code:CN-U-775)

BI-RADS CATEGORY  1: Negative.

## 2021-12-15 ENCOUNTER — Ambulatory Visit: Payer: Medicare Other | Admitting: Orthopedic Surgery

## 2021-12-15 ENCOUNTER — Encounter: Payer: Self-pay | Admitting: Orthopedic Surgery

## 2021-12-15 DIAGNOSIS — M542 Cervicalgia: Secondary | ICD-10-CM | POA: Diagnosis not present

## 2021-12-15 NOTE — Progress Notes (Signed)
Office Visit Note   Patient: Renee Fuentes           Date of Birth: [birth year]           MRN: [id number] Visit Date: 12/15/2021 Requested by: Ralene Ok, MD [id number] [place],  [place] [address] PCP: Ralene Ok, MD  Subjective: Chief Complaint  Patient presents with   Neck - Follow-up    HPI: Patient presents for evaluation of "neck" pain.  She is having some pain right around the base of her occiput bilaterally worse on the right than the left.  Not necessarily affecting her hearing.  Patient believes she may be carrying a lot of tension in her neck and strap muscles.  Takes Tylenol for symptoms which helps.  She has had therapy in the past which has helped her symptoms.  She does do a lot of work where she is looking down which can exacerbate her symptoms.  Reports some occasional rib and sternal pain but she has had stress test which was negative for cardiac issues.  Massage helps the pain in the back of her proximal neck.  Tiger balm and Vicks also helps.  Patient also reports having history of gastric issues.  Right side is worse than the left.              ROS: All systems reviewed are negative as they relate to the chief complaint within the history of present illness.  Patient denies  fevers or chills.   Assessment & Plan: Visit Diagnoses:  1. Neck pain     Plan: Impression is neck pain at the base of the occiput.  Looks more to be like tension type problem.  If hearing is affected I encouraged her to get worked up for acoustic neuroma as this area does have some pain as well.  No radiating numbness tingling or symptoms going into the scalp or the face.  Neck range of motion is full.  Plan is observation for now but she does have hearing test pending.  If her hearing is abnormal on the right-hand side she may want to consider further work-up with either neurology or neurosurgery.  Follow-Up Instructions: Return if symptoms worsen or fail to improve.   Orders:   No orders of the defined types were placed in this encounter.  No orders of the defined types were placed in this encounter.     Procedures: No procedures performed   Clinical Data: No additional findings.  Objective: Vital Signs: There were no vitals taken for this visit.  Physical Exam:   Constitutional: Patient appears well-developed HEENT:  Head: Normocephalic Eyes:EOM are normal Neck: Normal range of motion Cardiovascular: Normal rate Pulmonary/chest: Effort normal Neurologic: Patient is alert Skin: Skin is warm Psychiatric: Patient has normal mood and affect   Ortho Exam: Ortho exam demonstrates full active and passive range of motion of the cervical spine.  Left arm demonstrates full active and passive range of motion with no coarse grinding or crepitus and excellent rotator cuff strength.  Passive range of motion is 75/120/180.  Does have mild tenderness but no spasm at the area at the base of her occiput right below and behind both ears. Specialty Comments:  No specialty comments available.  Imaging: No results found.   PMFS History: Patient Active Problem List   Diagnosis Date Noted   Elevated blood pressure reading without diagnosis of hypertension 02/13/2019   Mixed hyperlipidemia 02/13/2019   Benign labile hypertension 01/23/2019   Hypercholesterolemia 01/23/2019  Precordial pain 09/27/2012   CAD (coronary artery disease), native coronary artery 09/27/2012   Past Medical History:  Diagnosis Date   CAD (coronary artery disease)    a. 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  ETT-Myoview 10/09/12:  No ischemic ST changes on ECG, no ischemia, EF 68% (normal study)   HLD (hyperlipidemia)     Family History  Problem Relation Age of Onset   Hypotension Mother    Healthy Father    Breast cancer Sister    Healthy Son     Healthy Daughter    Healthy Daughter     Past Surgical History:  Procedure Laterality Date   ABDOMINAL HYSTERECTOMY     CARDIAC CATHETERIZATION  2013   Performed in [place], patient was told she had a stenosis but no PCI was performed.   CHOLECYSTECTOMY     Social History   Occupational History   Occupation: Self-employed  Tobacco Use   Smoking status: Never   Smokeless tobacco: Never  Vaping Use   Vaping Use: Never used  Substance and Sexual Activity   Alcohol use: No   Drug use: No   Sexual activity: Not on file

## 2022-02-06 ENCOUNTER — Other Ambulatory Visit: Payer: Self-pay | Admitting: Internal Medicine

## 2022-02-06 DIAGNOSIS — Z1231 Encounter for screening mammogram for malignant neoplasm of breast: Secondary | ICD-10-CM

## 2022-03-09 ENCOUNTER — Ambulatory Visit
Admission: RE | Admit: 2022-03-09 | Discharge: 2022-03-09 | Disposition: A | Discharge status: Home / Self Care | Payer: Medicare Other | Source: Ambulatory Visit | Attending: Internal Medicine | Admitting: Internal Medicine

## 2022-03-09 DIAGNOSIS — Z1231 Encounter for screening mammogram for malignant neoplasm of breast: Secondary | ICD-10-CM

## 2022-04-12 ENCOUNTER — Encounter: Payer: Self-pay | Admitting: Cardiology

## 2022-04-12 ENCOUNTER — Ambulatory Visit: Payer: Medicare Other | Admitting: Cardiology

## 2022-04-12 VITALS — BP 142/74 | HR 78 | Resp 16 | Ht 62.0 in | Wt 129.0 lb

## 2022-04-12 DIAGNOSIS — E782 Mixed hyperlipidemia: Secondary | ICD-10-CM

## 2022-04-12 DIAGNOSIS — R030 Elevated blood-pressure reading, without diagnosis of hypertension: Secondary | ICD-10-CM

## 2022-04-12 DIAGNOSIS — I10 Essential (primary) hypertension: Secondary | ICD-10-CM

## 2022-04-12 NOTE — Progress Notes (Signed)
Follow up visit  Subjective:   Renee Fuentes, female    DOB: 11/16/1955, 66 y.o.   MRN: [id number]   Chief Complaint  Patient presents with   Hyperlipidemia   Follow-up   Hypertension    HPI  66 year old Asian [place] female with hyperlipidemia, GERD, with elevated blood pressure.  Patient is doing well. She walks 3-4 miles daily, recently participated in [place] without any symptoms of chest pain or shortness of breath. She has fullness at rest from time to time that has been attributed to flatulence. Blood pressure elevated today, but completely normal at home. Recent labs checked by PCP not available to me today.   Current Outpatient Medications:    Cholecalciferol (D3-1000 PO), Take 1 capsule by mouth daily., Disp: , Rfl:    Cyanocobalamin (VITAMIN B-12 PO), Take 1 tablet by mouth daily., Disp: , Rfl:    Multiple Vitamin (MULTIVITAMIN WITH MINERALS) TABS tablet, Take 1 tablet by mouth daily., Disp: , Rfl:    rosuvastatin (CRESTOR) 10 MG tablet, TAKE 1 TABLET (10 MG TOTAL) BY MOUTH DAILY., Disp: 90 tablet, Rfl: 0  Cardiovascular studies:  EKG 04/12/2022: Sinus rhythm 81 bpm Nonspecific ST depression  Lexiscan (Walking with mod Bruce)Tetrofosmin Stress Test  09/27/2020: Nondiagnostic ECG stress. The patient was injected with 0.4 mg of Intravenous Lexiscan over 15 sec. Additionally, patient exercised on a Modified Bruce protocol. Peak EKG/ECG demonstrated normal sinus rhythm. 1 mm upsloping ST depression present. Recovery EKG/ECG revealed non-specific T-wave abnormalities. Myocardial perfusion is normal. Overall LV systolic function is normal without regional wall motion abnormalities. Stress LV EF: 74%.  No previous exam available for comparison. Low risk study.  Echocardiogram 04/03/2017: Left ventricle cavity is normal in size. Normal global wall motion. Normal diastolic filling pattern. Calculated EF 55%. Mild tricuspid regurgitation. Pulmonary artery systolic  pressure is estimated at 30-35 mm Hg.    Cath- 2013[facility], [place] No significant high-grade stenosis. 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 Ejection fraction 60% with mildly elevated LVEDP   Recent labs: 2022: Glucose 181. Cholesterol 130, triglycerides 96, HDL 49, LDL 62  01/2019: LDL 88   Review of Systems  Cardiovascular:  Negative for chest pain, dyspnea on exertion, leg swelling, palpitations and syncope.  Musculoskeletal:  Positive for back pain.        Vitals:   04/12/22 1012 04/12/22 1016  BP: (!) 161/71 (!) 153/76  Pulse: 82 87  Resp: 16   SpO2: 100%      Body mass index is 23.59 kg/m. Filed Weights   04/12/22 1012  Weight: 129 lb (58.5 kg)     Objective:   Physical Exam Vitals and nursing note reviewed.  Constitutional:      Appearance: She is well-developed.  Neck:     Vascular: No JVD.  Cardiovascular:     Rate and Rhythm: Normal rate and regular rhythm.     Pulses: Intact distal pulses.     Heart sounds: Normal heart sounds. No murmur heard. Pulmonary:     Effort: Pulmonary effort is normal.     Breath sounds: Normal breath sounds. No wheezing or rales.         Assessment & Recommendations:   67 year old Asian [place] female with hyperlipidemia, GERD, now with elevated blood pressure.   Elevated blood pressure without diagnosis of hypertension: Better on second check. Suspect white coat hypertension  Atypical chest pain: Previous mild nonobstructive CAD on coronary angiogram in 2013. No ischemic on stress  testing (09/2020) Likely musculoskeletal or GI etiology  Mixed hyperlipidemia: Currently on Crestor 20 mg daily. Get lipid panel results from PCP  F/u in 1 year   Nigel Mormon, MD Pager: [phone] Office: [phone]

## 2022-04-13 ENCOUNTER — Encounter: Payer: Self-pay | Admitting: Cardiology

## 2022-04-13 ENCOUNTER — Ambulatory Visit: Payer: Medicare Other | Admitting: Cardiology

## 2022-04-13 VITALS — BP 151/82 | HR 87 | Resp 16 | Ht 62.0 in | Wt 124.0 lb

## 2022-04-13 DIAGNOSIS — R072 Precordial pain: Secondary | ICD-10-CM

## 2022-04-13 NOTE — Progress Notes (Signed)
Follow up visit  Subjective:   Renee Fuentes, female    DOB: 05-13-1956, 66 y.o.   MRN: [id number]   Chief Complaint  Patient presents with   Chest Pain   Hypertension   Arm Pain    LT   Dizziness    HPI  66 y/o [place] American female with hyperlipidemia, GERD, with elevated blood pressure.  Patient was just seen by me yesterday. Last night, she had a stressful community meeting, Following this, her blood pressure was elevated upto 160/90 mmHg, necessitating use of prn hydralazine. Middle of night, she had left arm pain, that improved with muscle relaxant.   Reviewed recent test results with the patient, details below.     Current Outpatient Medications:    Cholecalciferol (D3-1000 PO), Take 1 capsule by mouth daily., Disp: , Rfl:    Cyanocobalamin (VITAMIN B-12 PO), Take 1 tablet by mouth daily., Disp: , Rfl:    Multiple Vitamin (MULTIVITAMIN WITH MINERALS) TABS tablet, Take 1 tablet by mouth daily., Disp: , Rfl:    rosuvastatin (CRESTOR) 10 MG tablet, TAKE 1 TABLET (10 MG TOTAL) BY MOUTH DAILY., Disp: 90 tablet, Rfl: 0  Cardiovascular studies:  EKG 04/13/2022: Sinus rhythm 88 bpm Low voltage in precordial leads No significant ST-T changes  EKG 04/12/2022: Sinus rhythm 81 bpm Nonspecific ST depression  Lexiscan (Walking with mod Bruce)Tetrofosmin Stress Test  09/27/2020: Nondiagnostic ECG stress. The patient was injected with 0.4 mg of Intravenous Lexiscan over 15 sec. Additionally, patient exercised on a Modified Bruce protocol. Peak EKG/ECG demonstrated normal sinus rhythm. 1 mm upsloping ST depression present. Recovery EKG/ECG revealed non-specific T-wave abnormalities. Myocardial perfusion is normal. Overall LV systolic function is normal without regional wall motion abnormalities. Stress LV EF: 74%.  No previous exam available for comparison. Low risk study.  Echocardiogram 04/03/2017: Left ventricle cavity is normal in size. Normal global wall motion.  Normal diastolic filling pattern. Calculated EF 55%. Mild tricuspid regurgitation. Pulmonary artery systolic pressure is estimated at 30-35 mm Hg.    Cath- 2013[facility], [place] No significant high-grade stenosis. 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 Ejection fraction 60% with mildly elevated LVEDP   Recent labs: 01/23/2022: Glucose 89, BUN/Cr 15/0.79. EGFR 81. Na/K 141/4.0. Rest of the CMP normal H/H 13/40. MCV 89. Platelets 204 HbA1C 5.4% Chol 171, TG 99, HDL 61, LDL 91  2022: Glucose 181. Cholesterol 130, triglycerides 96, HDL 49, LDL 62  01/2019: LDL 88   Review of Systems  Cardiovascular:  Positive for chest pain. Negative for dyspnea on exertion, leg swelling, palpitations and syncope.  Musculoskeletal:  Negative for back pain.       Shoulder and arm pain        Vitals:   04/13/22 1023 04/13/22 1024  BP:    Pulse:    Resp:    SpO2: 100% 100%     Body mass index is 22.68 kg/m. Filed Weights   04/13/22 1017  Weight: 124 lb (56.2 kg)     Objective:   Physical Exam Vitals and nursing note reviewed.  Constitutional:      Appearance: She is well-developed.  Neck:     Vascular: No JVD.  Cardiovascular:     Rate and Rhythm: Normal rate and regular rhythm.     Pulses: Intact distal pulses.     Heart sounds: Normal heart sounds. No murmur heard. Pulmonary:     Effort: Pulmonary effort is normal.     Breath sounds:  Normal breath sounds. No wheezing or rales.  Musculoskeletal:     Right lower leg: No edema.     Left lower leg: No edema.         Assessment & Recommendations:   66 y/o [name] female with hyperlipidemia, GERD, with elevated blood pressure.  Episode from yesterday due to stress and hypertension. Arm pain likely musculoskeletal. EKG with no ischemic changes. Reassured the patient. No change made to medical therapy today.    Nigel Mormon, MD Pager: [phone] Office:  [phone]

## 2022-07-26 ENCOUNTER — Encounter: Payer: Self-pay | Admitting: Orthopedic Surgery

## 2022-07-26 ENCOUNTER — Ambulatory Visit: Payer: Medicare Other | Admitting: Orthopedic Surgery

## 2022-07-26 ENCOUNTER — Ambulatory Visit (INDEPENDENT_AMBULATORY_CARE_PROVIDER_SITE_OTHER): Payer: Medicare Other

## 2022-07-26 DIAGNOSIS — M545 Low back pain, unspecified: Secondary | ICD-10-CM | POA: Diagnosis not present

## 2022-07-26 DIAGNOSIS — M546 Pain in thoracic spine: Secondary | ICD-10-CM

## 2022-07-26 NOTE — Progress Notes (Signed)
Office Visit Note   Patient: Renee Fuentes           Date of Birth: Feb 08, 1956           MRN: [id number] Visit Date: 07/26/2022 Requested by: Jilda Panda, MD [id number],  [place] [address] PCP: Jilda Panda, MD  Subjective: Chief Complaint  Patient presents with   Middle Back - Pain    HPI: Renee Fuentes is a 67 y.o. female who presents to the office reporting thoracic back pain.  Reports pain in the middle of her back.  She was holding her grandchild a lot over the past several weeks.  Using heating pad.  Has had therapy for this in the past.  She has been doing some of those exercises.  Describes pain between both scapula.  Radiates around at times.  Does not give her shortness of breath.  She is able to work.  Tried Voltaren cream..                ROS: All systems reviewed are negative as they relate to the chief complaint within the history of present illness.  Patient denies fevers or chills.  Assessment & Plan: Visit Diagnoses:  1. Pain in thoracic spine   2. Low back pain, unspecified back pain laterality, unspecified chronicity, unspecified whether sciatica present     Plan: Impression is no definite radiographic abnormalities in the thoracic spine.  Plan is to resume therapy exercises.  If symptoms do not improve we can send her back to formal physical therapy.  Taking some anti-inflammatories and muscle relaxers could also help this but she wants to avoid medication for now.  Follow-up as needed  Follow-Up Instructions: No follow-ups on file.   Orders:  Orders Placed This Encounter  Procedures   XR Thoracic Spine 2 View   No orders of the defined types were placed in this encounter.     Procedures: No procedures performed   Clinical Data: No additional findings.  Objective: Vital Signs: There were no vitals taken for this visit.  Physical Exam:  Constitutional: Patient appears well-developed HEENT:  Head:  Normocephalic Eyes:EOM are normal Neck: Normal range of motion Cardiovascular: Normal rate Pulmonary/chest: Effort normal Neurologic: Patient is alert Skin: Skin is warm Psychiatric: Patient has normal mood and affect  Ortho Exam: Ortho exam demonstrates no scapular dyskinesia with forward flexion.  No masses lymphadenopathy or rashes noted in that back region.  Mild pain with forward flexion.  Motor strength in the lower extremities and upper extremities intact.  No definite palpation tenderness along the thoracic spine.  Specialty Comments:  No specialty comments available.  Imaging: XR Thoracic Spine 2 View  Result Date: 07/26/2022 AP lateral radiographs thoracic spine reviewed.  Mild degenerative changes noted.  No significant kyphosis or scoliosis present.  No compression fractures.    PMFS History: Patient Active Problem List   Diagnosis Date Noted   Elevated blood pressure reading without diagnosis of hypertension 02/13/2019   Mixed hyperlipidemia 02/13/2019   Benign labile hypertension 01/23/2019   Hypercholesterolemia 01/23/2019   Precordial pain 09/27/2012   CAD (coronary artery disease), native coronary artery 09/27/2012   Past Medical History:  Diagnosis Date   CAD (coronary artery disease)    a. LHC in [facility] [place] in 09/2011 that demonstrated a normal RCA and normal CFX with some myocardial bridging in a small caliber LAD ("diffuse arteriopathy mid to dist LAD");  b.  ETT-Myoview 10/09/12:  No ischemic ST changes on  ECG, no ischemia, EF 68% (normal study)   HLD (hyperlipidemia)     Family History  Problem Relation Age of Onset   Hypotension Mother    Healthy Father    Breast cancer Sister    Healthy Son    Healthy Daughter    Healthy Daughter     Past Surgical History:  Procedure Laterality Date   ABDOMINAL HYSTERECTOMY     CARDIAC CATHETERIZATION  2013   Performed in [place], patient was told she had a stenosis but no PCI was performed.    CHOLECYSTECTOMY     Social History   Occupational History   Occupation: Self-employed  Tobacco Use   Smoking status: Never   Smokeless tobacco: Never  Vaping Use   Vaping Use: Never used  Substance and Sexual Activity   Alcohol use: No   Drug use: No   Sexual activity: Not on file

## 2022-12-07 ENCOUNTER — Other Ambulatory Visit: Payer: Self-pay | Admitting: Obstetrics & Gynecology

## 2022-12-07 DIAGNOSIS — N951 Menopausal and female climacteric states: Secondary | ICD-10-CM

## 2022-12-07 DIAGNOSIS — E2839 Other primary ovarian failure: Secondary | ICD-10-CM

## 2022-12-08 ENCOUNTER — Encounter: Payer: Self-pay | Admitting: Cardiology

## 2022-12-08 ENCOUNTER — Other Ambulatory Visit: Payer: Self-pay

## 2022-12-08 ENCOUNTER — Telehealth: Payer: Self-pay

## 2022-12-08 ENCOUNTER — Ambulatory Visit: Payer: Medicare Other | Admitting: Cardiology

## 2022-12-08 VITALS — BP 137/78 | HR 77 | Resp 16 | Ht 62.0 in | Wt 127.6 lb

## 2022-12-08 DIAGNOSIS — R079 Chest pain, unspecified: Secondary | ICD-10-CM

## 2022-12-08 DIAGNOSIS — R9431 Abnormal electrocardiogram [ECG] [EKG]: Secondary | ICD-10-CM

## 2022-12-08 DIAGNOSIS — R072 Precordial pain: Secondary | ICD-10-CM

## 2022-12-08 MED ORDER — METOPROLOL TARTRATE 50 MG PO TABS: 50.0000 mg | ORAL_TABLET | Freq: Two times a day (BID) | ORAL | 1 refills | Status: DC

## 2022-12-08 NOTE — Telephone Encounter (Signed)
Okay to add

## 2022-12-08 NOTE — Telephone Encounter (Signed)
Pt has chest pressure weakness and wants to be seen

## 2022-12-08 NOTE — Patient Instructions (Signed)
Your cardiac CT will be scheduled at the locations below:   [facility]  [address]  [place], [place] [address]  [phone]    If scheduled at [facility], please arrive at the North Tower main entrance of [facility] 30-45 minutes prior to test start time.  Proceed to the [facility] Department (first floor) to check-in and test prep.   Please follow these instructions carefully (unless otherwise directed):    On the Night Before the Test:   Be sure to Drink plenty of water.   Do not consume any caffeinated/decaffeinated beverages or chocolate 12 hours prior to your test.   Do not take any antihistamines 12 hours prior to your test.   If the patient has contrast allergy:  Patient will need a prescription for Prednisone and very clear instructions (as follows):  1. Prednisone 50 mg - take 13 hours prior to test  2. Take another Prednisone 50 mg 7 hours prior to test  3. Take another Prednisone 50 mg 1 hour prior to test  4. Take Benadryl 50 mg 1 hour prior to test   Patient must complete all four doses of above prophylactic medications.   Patient will need a ride after test due to Benadryl.   On the Day of the Test:   Drink plenty of water. Do not drink any water within one hour of the test.   Do not eat any food 4 hours prior to the test.   You may take your regular medications prior to the test.    - Metoprolol tartarate 50 mg 2 hours before CT scan You may stop it after the CT scan, unless specified otherwise by me.    FEMALES- please wear underwire-free bra if available          After the Test:   Drink plenty of water.   After receiving IV contrast, you may experience a mild flushed feeling. This is normal.   On occasion, you may experience a mild rash up to 24 hours after the test. This is not dangerous. If this occurs, you can take Benadryl 25 mg and increase your fluid intake.   If you experience trouble  breathing, this can be serious. If it is severe call 911 IMMEDIATELY. If it is mild, please call our office.   If you take any of these medications: Glipizide/Metformin, Avandament, Glucavance, please do not take 48 hours after completing test unless otherwise instructed.     Please contact the cardiac imaging nurse navigator should you have any questions/concerns  Sara Wallace, RN Navigator Cardiac Imaging  [facility]  [phone] Office  [phone] Cell   

## 2022-12-08 NOTE — Progress Notes (Signed)
Follow up visit  Subjective:   Renee Fuentes, female    DOB: 10-11-1955, 67 y.o.   MRN: [id number]   Chief Complaint  Patient presents with   Chest Pain    HPI  67 y/o [place] American female with hyperlipidemia, GERD, with chest pain  Patient had episode of retrosternal chest pain this morning at 2 AM.  Blood pressure slightly elevated.  She did have emotional stress with regards to son's flight delay.  She continues to have these short lasting chest, as well as back and arm pain episodes from time to time.  Previously, she had negative stress test in 2022.  Coronary angiogram in 2013 showed mild disease.   Current Outpatient Medications:    Cholecalciferol (D3-1000 PO), Take 1 capsule by mouth daily., Disp: , Rfl:    Cyanocobalamin (VITAMIN B-12 PO), Take 1 tablet by mouth daily., Disp: , Rfl:    Multiple Vitamin (MULTIVITAMIN WITH MINERALS) TABS tablet, Take 1 tablet by mouth daily., Disp: , Rfl:    rosuvastatin (CRESTOR) 10 MG tablet, TAKE 1 TABLET (10 MG TOTAL) BY MOUTH DAILY., Disp: 90 tablet, Rfl: 0  Cardiovascular studies:  EKG 12/08/2022: Sinus rhythm 70 bpm  Low voltage in precordial leads Nonspecific T wave abnormality   EKG 04/12/2022: Sinus rhythm 81 bpm Nonspecific ST depression  Lexiscan (Walking with mod Bruce)Tetrofosmin Stress Test  09/27/2020: Nondiagnostic ECG stress. The patient was injected with 0.4 mg of Intravenous Lexiscan over 15 sec. Additionally, patient exercised on a Modified Bruce protocol. Peak EKG/ECG demonstrated normal sinus rhythm. 1 mm upsloping ST depression present. Recovery EKG/ECG revealed non-specific T-wave abnormalities. Myocardial perfusion is normal. Overall LV systolic function is normal without regional wall motion abnormalities. Stress LV EF: 74%.  No previous exam available for comparison. Low risk study.  Echocardiogram 04/03/2017: Left ventricle cavity is normal in size. Normal global wall motion. Normal diastolic  filling pattern. Calculated EF 55%. Mild tricuspid regurgitation. Pulmonary artery systolic pressure is estimated at 30-35 mm Hg.    Cath- 2013[facility], [place] No significant high-grade stenosis. 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 Ejection fraction 60% with mildly elevated LVEDP   Recent labs: 01/23/2022: Glucose 89, BUN/Cr 15/0.79. EGFR 81. Na/K 141/4.0. Rest of the CMP normal H/H 13/40. MCV 89. Platelets 204 HbA1C 5.4% Chol 171, TG 99, HDL 61, LDL 91  2022: Glucose 181. Cholesterol 130, triglycerides 96, HDL 49, LDL 62  01/2019: LDL 88   Review of Systems  Cardiovascular:  Positive for chest pain. Negative for dyspnea on exertion, leg swelling, palpitations and syncope.  Musculoskeletal:  Negative for back pain.       Shoulder and arm pain        Vitals:   12/08/22 1510  BP: 137/78  Pulse: 77  Resp: 16  SpO2: 99%     Body mass index is 23.34 kg/m. Filed Weights   12/08/22 1510  Weight: 127 lb 9.6 oz (57.9 kg)     Objective:   Physical Exam Vitals and nursing note reviewed.  Constitutional:      Appearance: She is well-developed.  Neck:     Vascular: No JVD.  Cardiovascular:     Rate and Rhythm: Normal rate and regular rhythm.     Pulses: Intact distal pulses.     Heart sounds: Normal heart sounds. No murmur heard. Pulmonary:     Effort: Pulmonary effort is normal.     Breath sounds: Normal breath sounds. No wheezing or  rales.  Musculoskeletal:     Right lower leg: No edema.     Left lower leg: No edema.         Assessment & Recommendations:    67 y/o [place] female with hyperlipidemia, GERD, with chest pain  Chest pain: Recurrent chest pain episodes without any typical anginal symptoms.  EKG nonspecific.  Previously normal stress test in 2022 reportedly had mild LAD disease on coronary angiogram in 2013.  She does have hyperlipidemia.  I believe coronary CT angiogram will give  definitive coronary anatomy evaluation.  Ordered the same.  F/u in 6 months    Elder Negus, MD Pager: [phone] Office: [phone]

## 2023-01-01 ENCOUNTER — Other Ambulatory Visit: Payer: Self-pay

## 2023-01-01 MED ORDER — METOPROLOL TARTRATE 50 MG PO TABS: 50.0000 mg | ORAL_TABLET | Freq: Two times a day (BID) | ORAL | 1 refills | Status: DC

## 2023-01-03 ENCOUNTER — Ambulatory Visit (HOSPITAL_COMMUNITY)
Admission: RE | Admit: 2023-01-03 | Discharge: 2023-01-03 | Disposition: A | Discharge status: Home / Self Care | Payer: Medicare Other | Source: Ambulatory Visit | Attending: Internal Medicine | Admitting: Internal Medicine

## 2023-01-03 DIAGNOSIS — R9431 Abnormal electrocardiogram [ECG] [EKG]: Secondary | ICD-10-CM | POA: Diagnosis not present

## 2023-01-03 DIAGNOSIS — R072 Precordial pain: Secondary | ICD-10-CM | POA: Insufficient documentation

## 2023-01-03 LAB — BASIC METABOLIC PANEL
BUN/Creatinine Ratio: 9 — ABNORMAL LOW (ref 12–28)
BUN: 7 mg/dL — ABNORMAL LOW (ref 8–27)
CO2: 25 mmol/L (ref 20–29)
Calcium: 9.2 mg/dL (ref 8.7–10.3)
Chloride: 104 mmol/L (ref 96–106)
Creatinine, Ser: 0.8 mg/dL (ref 0.57–1.00)
Glucose: 86 mg/dL (ref 70–99)
Potassium: 3.8 mmol/L (ref 3.5–5.2)
Sodium: 140 mmol/L (ref 134–144)
eGFR: 80 mL/min/{1.73_m2} (ref >59)

## 2023-01-03 MED ORDER — IOHEXOL 350 MG/ML SOLN
190.0000 mL | Freq: Once | INTRAVENOUS | Status: AC | PRN
  Administered 2023-01-03: 190 mL via INTRAVENOUS

## 2023-01-03 MED ORDER — NITROGLYCERIN 0.4 MG SL SUBL
SUBLINGUAL_TABLET | SUBLINGUAL | Status: AC
  Filled 2023-01-03: qty 2

## 2023-01-03 MED ORDER — NITROGLYCERIN 0.4 MG SL SUBL
0.8000 mg | SUBLINGUAL_TABLET | SUBLINGUAL | Status: DC | PRN
  Administered 2023-01-03: 0.8 mg via SUBLINGUAL

## 2023-01-24 DIAGNOSIS — R9431 Abnormal electrocardiogram [ECG] [EKG]: Secondary | ICD-10-CM | POA: Insufficient documentation

## 2023-04-13 ENCOUNTER — Ambulatory Visit: Payer: Medicare Other | Admitting: Cardiology

## 2023-04-26 ENCOUNTER — Ambulatory Visit: Payer: Medicare Other | Admitting: Cardiology

## 2023-05-08 ENCOUNTER — Ambulatory Visit: Payer: Medicare Other | Admitting: Physician Assistant

## 2023-05-08 ENCOUNTER — Encounter: Payer: Self-pay | Admitting: Physician Assistant

## 2023-05-08 DIAGNOSIS — M545 Low back pain, unspecified: Secondary | ICD-10-CM | POA: Insufficient documentation

## 2023-05-08 DIAGNOSIS — M546 Pain in thoracic spine: Secondary | ICD-10-CM | POA: Diagnosis not present

## 2023-05-08 DIAGNOSIS — M5440 Lumbago with sciatica, unspecified side: Secondary | ICD-10-CM | POA: Diagnosis not present

## 2023-05-08 NOTE — Progress Notes (Signed)
Office Visit Note   Patient: Renee Fuentes           Date of Birth: 07/08/1955           MRN: [id number] Visit Date: 05/08/2023              Requested by: Ralene Ok, MD [address] [place],  [place] [address] PCP: Ralene Ok, MD   Assessment & Plan: Visit Diagnoses:  1. Acute bilateral low back pain with sciatica, sciatica laterality unspecified   2. Acute bilateral thoracic back pain     Plan: Patient is a pleasant 68 year old woman coming in for reevaluation of thoracic back pain.  She denies any recent injuries.  She was seen by Dr. August Saucer earlier this year and did self exercises.  She denies any radicular symptoms.  She thinks some of this is from helping to carry for her grandchild.  She denies any weakness loss of bowel or bladder control.  She did have some dry needling with physical therapy in the past and thought it was quite helpful.  She does not really want to take medication if possible  Follow-Up Instructions: If no better  Orders:  Orders Placed This Encounter  Procedures   Ambulatory referral to Physical Therapy   No orders of the defined types were placed in this encounter.     Procedures: No procedures performed   Clinical Data: No additional findings.   Subjective: Chief Complaint  Patient presents with   Lower Back - Pain    HPI pleasant 67 year old woman with a history of thoracic back pain.  No particular recent injury no numbness tingling or weakness.  She has tried massage which has helped a little bit.  She had PT in the past with dry needling which she thinks also helped.  Review of Systems  All other systems reviewed and are negative.    Objective: Vital Signs: There were no vitals taken for this visit.  Physical Exam Constitutional:      Appearance: Normal appearance.  Skin:    General: Skin is warm and dry.  Neurological:     General: No focal deficit present.     Mental Status: She is alert.     Ortho  Exam Examination of her thoracic spine no scoliosis she has good abduction strength no scapular winging neuro sensation is intact no step-offs noted Specialty Comments:  No specialty comments available.  Imaging: No results found.   PMFS History: Patient Active Problem List   Diagnosis Date Noted   Low back pain 05/08/2023   Abnormal electrocardiogram 01/24/2023   Elevated blood pressure reading without diagnosis of hypertension 02/13/2019   Mixed hyperlipidemia 02/13/2019   Benign labile hypertension 01/23/2019   Hypercholesterolemia 01/23/2019   Precordial pain 09/27/2012   CAD (coronary artery disease), native coronary artery 09/27/2012   Past Medical History:  Diagnosis Date   CAD (coronary artery disease)    a. 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  ETT-Myoview 10/09/12:  No ischemic ST changes on ECG, no ischemia, EF 68% (normal study)   HLD (hyperlipidemia)     Family History  Problem Relation Age of Onset   Hypotension Mother    Healthy Father    Breast cancer Sister    Healthy Son    Healthy Daughter    Healthy Daughter  Past Surgical History:  Procedure Laterality Date   ABDOMINAL HYSTERECTOMY     CARDIAC CATHETERIZATION  2013   Performed in [facility], patient was told she had a stenosis but no PCI was performed.   CHOLECYSTECTOMY     Social History   Occupational History   Occupation: Self-employed  Tobacco Use   Smoking status: Never   Smokeless tobacco: Never  Vaping Use   Vaping status: Never Used  Substance and Sexual Activity   Alcohol use: No   Drug use: No   Sexual activity: Not on file

## 2023-05-14 ENCOUNTER — Ambulatory Visit: Payer: Self-pay | Admitting: Cardiology

## 2023-05-21 ENCOUNTER — Encounter: Payer: Self-pay | Admitting: Cardiology

## 2023-05-21 ENCOUNTER — Ambulatory Visit: Payer: Medicare Other | Attending: Cardiology | Admitting: Cardiology

## 2023-05-21 VITALS — BP 148/80 | HR 77 | Resp 16 | Ht 62.0 in | Wt 127.6 lb

## 2023-05-21 DIAGNOSIS — E782 Mixed hyperlipidemia: Secondary | ICD-10-CM | POA: Diagnosis not present

## 2023-05-21 DIAGNOSIS — R030 Elevated blood-pressure reading, without diagnosis of hypertension: Secondary | ICD-10-CM

## 2023-05-21 DIAGNOSIS — I700 Atherosclerosis of aorta: Secondary | ICD-10-CM | POA: Diagnosis not present

## 2023-05-21 MED ORDER — HYDRALAZINE HCL 25 MG PO TABS: 25.0000 mg | ORAL_TABLET | Freq: Three times a day (TID) | ORAL | 3 refills | Status: DC

## 2023-05-21 NOTE — Patient Instructions (Signed)
Medication Instructions:  Your physician has recommended you make the following change in your medication:   1) START hydralazine 25 mg every 8 hours  *If you need a refill on your cardiac medications before your next appointment, please call your pharmacy*  Lab Work: None ordered today.  Testing/Procedures: None ordered today.  Follow-Up: At [facility], you and your health needs are our priority.  As part of our continuing mission to provide you with exceptional heart care, we have created designated Provider Care Teams.  These Care Teams include your primary Cardiologist (physician) and Advanced Practice Providers (APPs -  Physician Assistants and Nurse Practitioners) who all work together to provide you with the care you need, when you need it.  Your next appointment:   1 year(s)  The format for your next appointment:   In Person  Provider:   Elder Negus, MD

## 2023-05-21 NOTE — Progress Notes (Signed)
  Cardiology Office Note:  .   Date:  05/21/2023  ID:  Renee Fuentes, DOB [birth year], MRN [id number] PCP: Ralene Ok, MD  [facility] Providers Cardiologist:  Truett Mainland, MD PCP: Ralene Ok, MD  Chief Complaint  Patient presents with   Follow-up      History of Present Illness: Marland Kitchen    Renee Fuentes is a 67 y.o. female with hyperlipidemia, GERD  Reviewed recent consult angiogram with the patient at length.  Blood pressure elevated today.  Blood pressure was reportedly normal this morning.  Patient has had upper back pain today.  She has had prior MRI scan and is noted to have at least some degree of possible degenerative disc disease.   Vitals:   05/21/23 1453  BP: (!) 155/77  Pulse: 77  Resp: 16  SpO2: 99%     ROS:  Review of Systems  Cardiovascular:  Negative for chest pain, dyspnea on exertion, leg swelling, palpitations and syncope.  Musculoskeletal:  Positive for back pain.     Studies Reviewed: Marland Kitchen         Independently interpreted 12/2022: Cr 0.8 Hb 12.8   CCTA 12/2022: 1. Coronary calcium score of 0. 2. Normal coronary origin with right dominance. 3. Aortic atherosclerosis. 4. CAD-RADS = 0 No evidence of CAD within the limitation of the study (artifact).   Physical Exam:   Physical Exam Vitals and nursing note reviewed.  Constitutional:      General: She is not in acute distress. Neck:     Vascular: No JVD.  Cardiovascular:     Rate and Rhythm: Normal rate and regular rhythm.     Heart sounds: Normal heart sounds. No murmur heard. Pulmonary:     Effort: Pulmonary effort is normal.     Breath sounds: Normal breath sounds. No wheezing or rales.  Musculoskeletal:     Right lower leg: No edema.     Left lower leg: No edema.      VISIT DIAGNOSES:   ICD-10-CM   1. Mixed hyperlipidemia  E78.2     2. Elevated blood pressure reading without diagnosis of hypertension  R03.0 hydrALAZINE (APRESOLINE) 25 MG tablet     3. Aortic atherosclerosis (HCC)  I70.0        ASSESSMENT AND PLAN: .    Renee Fuentes is a 67 y.o. female with hyperlipidemia, GERD  Aortic atherosclerosis: Continue Crestor 10 mg daily.  Elevated blood pressure without diagnosis of hypertension: Back pain likely contributing.  Management of back pain as per PCP.  Added hydralazine 25 mg every 8 hours as needed for SBP >140 mmHg.     Meds ordered this encounter  Medications   hydrALAZINE (APRESOLINE) 25 MG tablet    Sig: Take 1 tablet (25 mg total) by mouth 3 (three) times daily.    Dispense:  270 tablet    Refill:  3     F/u in 1 year  Signed, Elder Negus, MD

## 2023-05-22 ENCOUNTER — Ambulatory Visit: Payer: Medicare Other | Admitting: Physical Therapy

## 2023-08-20 ENCOUNTER — Other Ambulatory Visit: Payer: Medicare Other

## 2023-08-21 ENCOUNTER — Other Ambulatory Visit: Payer: Self-pay | Admitting: Obstetrics & Gynecology

## 2023-08-21 DIAGNOSIS — E2839 Other primary ovarian failure: Secondary | ICD-10-CM

## 2023-08-21 DIAGNOSIS — N951 Menopausal and female climacteric states: Secondary | ICD-10-CM

## 2023-09-11 ENCOUNTER — Ambulatory Visit: Admitting: Physical Medicine and Rehabilitation

## 2023-09-11 ENCOUNTER — Encounter: Payer: Self-pay | Admitting: Physical Medicine and Rehabilitation

## 2023-09-11 DIAGNOSIS — M542 Cervicalgia: Secondary | ICD-10-CM

## 2023-09-11 DIAGNOSIS — M25512 Pain in left shoulder: Secondary | ICD-10-CM

## 2023-09-11 DIAGNOSIS — M546 Pain in thoracic spine: Secondary | ICD-10-CM

## 2023-09-11 DIAGNOSIS — G8929 Other chronic pain: Secondary | ICD-10-CM

## 2023-09-11 DIAGNOSIS — M25511 Pain in right shoulder: Secondary | ICD-10-CM | POA: Diagnosis not present

## 2023-09-11 DIAGNOSIS — M7918 Myalgia, other site: Secondary | ICD-10-CM | POA: Diagnosis not present

## 2023-09-11 NOTE — Progress Notes (Unsigned)
 Pain Scale   Average Pain 4        +Driver, -BT, -Dye Allergies.

## 2023-09-11 NOTE — Progress Notes (Unsigned)
 Renee Fuentes - 68 y.o. female MRN [id number]  Date of birth: 11-19-1955  Office Visit Note: Visit Date: 09/11/2023 PCP: Ralene Ok, MD Referred by: Ralene Ok, MD  Subjective: Chief Complaint  Patient presents with   Neck - Pain   HPI: Renee Fuentes is a 68 y.o. female who comes in today as a self referral for evaluation of chronic, worsening and severe bilateral neck pain radiating to shoulders and down to left thoracic back. Pain ongoing intermittently for several years. She feels her pain worsens with certain foods that she eats. Also states her doctor told her that her acid reflux could also contribute to her symptoms. She describes pain as tight and stiff sensation, denies pain at this time. Some relief of pain with home exercise regimen, heating pad, rest and use of medications. History of massage therapy in [place] that significantly helped to alleviate her pain. No recent formal physical therapy. Cervical radiographs from 2019 show moderate multilevel degenerative disc disease. Patient denies cervical surgery/injections. She has seen Dr. Dorene Grebe in the past for same issue. Patient reports chronic issues with anxiety and stress. Patient denies focal weakness, numbness and tingling. No recent trauma or falls.        Review of Systems  Musculoskeletal:  Positive for back pain, myalgias and neck pain.  Neurological:  Negative for tingling, sensory change, focal weakness and weakness.  All other systems reviewed and are negative.  Otherwise per HPI.  Assessment & Plan: Visit Diagnoses:    ICD-10-CM   1. Cervicalgia  M54.2 Ambulatory referral to Physical Therapy    2. Chronic pain of both shoulders  M25.511 Ambulatory referral to Physical Therapy   G89.29    M25.512     3. Myofascial pain syndrome  M79.18 Ambulatory referral to Physical Therapy    4. Chronic left-sided thoracic back pain  M54.6 Ambulatory referral to Physical Therapy   G89.29         Plan: Findings:  Chronic, worsening and severe bilateral neck pain radiating to shoulders down to left thoracic back. Patient continues to have severe pain despite good conservative therapies such as home exercise regimen, massage therapy, heating pad and medications. Patients clinical presentation and exam are consistent with myofascial pain syndrome. Myofascial tenderness noted to bilateral levator scapulae and trapezius region, there are also multiple palpable trigger points noted to left thoracic region. We discussed treatment plan in detail today. Next step is to place order for short course of formal physical therapy with a focus on manual treatments and dry needling. I encouraged her to continue with home exercise regimen. We discussed medication management, she would prefer to hold on medications at this time. Should her symptoms present as more radicular in nature would consider obtaining cervical MRI imaging. Patient has no questions at this time. We are happy to see her back as needed. No red flag symptoms noted upon exam today.     Meds & Orders: No orders of the defined types were placed in this encounter.   Orders Placed This Encounter  Procedures   Ambulatory referral to Physical Therapy    Follow-up: Return if symptoms worsen or fail to improve.   Procedures: No procedures performed      Clinical History: No specialty comments available.   She reports that she has never smoked. She has never used smokeless tobacco. No results for input(s): "HGBA1C", "LABURIC" in the last 8760 hours.  Objective:  VS:  HT:    WT:  BMI:     BP:   HR: bpm  TEMP: ( )  RESP:  Physical Exam Vitals and nursing note reviewed.  HENT:     Head: Normocephalic and atraumatic.     Right Ear: External ear normal.     Left Ear: External ear normal.     Nose: Nose normal.     Mouth/Throat:     Mouth: Mucous membranes are moist.  Eyes:     Extraocular Movements: Extraocular movements intact.   Cardiovascular:     Rate and Rhythm: Normal rate.     Pulses: Normal pulses.  Pulmonary:     Effort: Pulmonary effort is normal.  Abdominal:     General: Abdomen is flat. There is no distension.  Musculoskeletal:        General: Tenderness present.     Cervical back: Tenderness present.     Comments: No discomfort noted with flexion, extension and side-to-side rotation. Patient has good strength in the upper extremities including 5 out of 5 strength in wrist extension, long finger flexion and APB. Shoulder range of motion is full bilaterally without any sign of impingement. There is no atrophy of the hands intrinsically. Sensation intact bilaterally. Myofascial tenderness noted to bilateral levator scapulae and trapezius regions. Multiple palpable trigger points noted to left rhomboid region. Negative Hoffman's sign. Negative Spurling's sign.     Skin:    General: Skin is warm and dry.     Capillary Refill: Capillary refill takes less than 2 seconds.  Neurological:     General: No focal deficit present.     Mental Status: She is alert and oriented to person, place, and time.  Psychiatric:        Mood and Affect: Mood normal.        Behavior: Behavior normal.     Ortho Exam  Imaging: No results found.  Past Medical/Family/Surgical/Social History: Medications & Allergies reviewed per EMR, new medications updated. Patient Active Problem List   Diagnosis Date Noted   Aortic atherosclerosis (HCC) 05/21/2023   Low back pain 05/08/2023   Abnormal electrocardiogram 01/24/2023   Elevated blood pressure reading without diagnosis of hypertension 02/13/2019   Mixed hyperlipidemia 02/13/2019   Benign labile hypertension 01/23/2019   Hypercholesterolemia 01/23/2019   Precordial pain 09/27/2012   CAD (coronary artery disease), native coronary artery 09/27/2012   Past Medical History:  Diagnosis Date   CAD (coronary artery disease)    a. LHC in [facility] [place] in 09/2011 that demonstrated  a normal RCA and normal CFX with some myocardial bridging in a small caliber LAD ("diffuse arteriopathy mid to dist LAD");  b.  ETT-Myoview 10/09/12:  No ischemic ST changes on ECG, no ischemia, EF 68% (normal study)   HLD (hyperlipidemia)    Family History  Problem Relation Age of Onset   Hypotension Mother    Healthy Father    Breast cancer Sister    Healthy Son    Healthy Daughter    Healthy Daughter    Past Surgical History:  Procedure Laterality Date   ABDOMINAL HYSTERECTOMY     CARDIAC CATHETERIZATION  2013   Performed in [place], patient was told she had a stenosis but no PCI was performed.   CHOLECYSTECTOMY     Social History   Occupational History   Occupation: Self-employed  Tobacco Use   Smoking status: Never   Smokeless tobacco: Never  Vaping Use   Vaping status: Never Used  Substance and Sexual Activity   Alcohol use:  No   Drug use: No   Sexual activity: Not on file

## 2023-09-21 ENCOUNTER — Telehealth: Payer: Self-pay | Admitting: Cardiology

## 2023-09-21 NOTE — Telephone Encounter (Signed)
 Blood pressure numbers look very good. Please reassure her that we do not need to add any medication for blood pressure. I do think anxiety is driving the symptoms.  Thanks MJP

## 2023-09-21 NOTE — Telephone Encounter (Signed)
 Pt reports elevated BP last night and this caused much anxiety.  She was just started on Midazole 500 mg and thinks this may be a factor in the elevated BP.  She is awaiting doctor to call back as she said she would not take anymore. Her typical BP are WNL, reported recent ones as:        125/75, 122/78, 124/68, 114/71 Aware that I am not sure we would do anything at this time, but will forward to MD as she is requesting to speak with him.

## 2023-09-21 NOTE — Telephone Encounter (Signed)
 Left voicemail to return call to office

## 2023-09-21 NOTE — Telephone Encounter (Signed)
 Pt c/o BP issue: STAT if pt c/o blurred vision, one-sided weakness or slurred speech  1. What are your last 5 BP readings? 169/85 last night  2. Are you having any other symptoms (ex. Dizziness, headache, blurred vision, passed out)? Fatigue  3. What is your BP issue? Pt is requesting a callback from nurse regarding her being concerned about BP being high last night and she couldn't sleep.

## 2023-09-21 NOTE — Telephone Encounter (Signed)
 Patient calling back, states she never received a call. She would really like to speak with Dr. Rosemary Holms so that she can speak in her language per patient.

## 2023-09-21 NOTE — Telephone Encounter (Signed)
 Spoke to pt and she was advised the information from Dr. Rosemary Holms. Pt reports that she was recently on Metronidazole and it has been discontinued by ortho due to symptoms possibly being caused by the med.

## 2023-09-21 NOTE — Telephone Encounter (Signed)
 Noted.  Thanks MJP

## 2023-10-01 ENCOUNTER — Encounter: Payer: Self-pay | Admitting: Physical Therapy

## 2023-10-01 ENCOUNTER — Other Ambulatory Visit: Payer: Self-pay

## 2023-10-01 ENCOUNTER — Ambulatory Visit: Attending: Physical Medicine and Rehabilitation | Admitting: Physical Therapy

## 2023-10-01 DIAGNOSIS — M7918 Myalgia, other site: Secondary | ICD-10-CM | POA: Diagnosis not present

## 2023-10-01 DIAGNOSIS — M546 Pain in thoracic spine: Secondary | ICD-10-CM

## 2023-10-01 DIAGNOSIS — M25512 Pain in left shoulder: Secondary | ICD-10-CM | POA: Diagnosis not present

## 2023-10-01 DIAGNOSIS — R293 Abnormal posture: Secondary | ICD-10-CM

## 2023-10-01 DIAGNOSIS — G8929 Other chronic pain: Secondary | ICD-10-CM | POA: Diagnosis not present

## 2023-10-01 DIAGNOSIS — M542 Cervicalgia: Secondary | ICD-10-CM

## 2023-10-01 DIAGNOSIS — M25511 Pain in right shoulder: Secondary | ICD-10-CM | POA: Insufficient documentation

## 2023-10-01 DIAGNOSIS — M6281 Muscle weakness (generalized): Secondary | ICD-10-CM

## 2023-10-01 DIAGNOSIS — R29898 Other symptoms and signs involving the musculoskeletal system: Secondary | ICD-10-CM | POA: Diagnosis present
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
## 2023-10-08 ENCOUNTER — Ambulatory Visit

## 2023-10-08 ENCOUNTER — Other Ambulatory Visit: Payer: Self-pay

## 2023-10-08 DIAGNOSIS — M6281 Muscle weakness (generalized): Secondary | ICD-10-CM

## 2023-10-08 DIAGNOSIS — M546 Pain in thoracic spine: Secondary | ICD-10-CM | POA: Diagnosis not present

## 2023-10-08 DIAGNOSIS — M542 Cervicalgia: Secondary | ICD-10-CM

## 2023-10-08 DIAGNOSIS — R29898 Other symptoms and signs involving the musculoskeletal system: Secondary | ICD-10-CM

## 2023-10-08 DIAGNOSIS — R293 Abnormal posture: Secondary | ICD-10-CM
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
## 2023-10-11 ENCOUNTER — Ambulatory Visit: Attending: Physical Medicine and Rehabilitation | Admitting: Physical Therapy

## 2023-10-11 ENCOUNTER — Encounter: Payer: Self-pay | Admitting: Physical Therapy

## 2023-10-11 DIAGNOSIS — M6281 Muscle weakness (generalized): Secondary | ICD-10-CM | POA: Insufficient documentation

## 2023-10-11 DIAGNOSIS — M546 Pain in thoracic spine: Secondary | ICD-10-CM | POA: Diagnosis present

## 2023-10-11 DIAGNOSIS — R293 Abnormal posture: Secondary | ICD-10-CM | POA: Insufficient documentation

## 2023-10-11 DIAGNOSIS — R29898 Other symptoms and signs involving the musculoskeletal system: Secondary | ICD-10-CM | POA: Diagnosis present

## 2023-10-11 DIAGNOSIS — M542 Cervicalgia: Secondary | ICD-10-CM | POA: Diagnosis present
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
## 2023-10-15 ENCOUNTER — Ambulatory Visit

## 2023-10-15 ENCOUNTER — Other Ambulatory Visit: Payer: Self-pay

## 2023-10-15 DIAGNOSIS — R293 Abnormal posture: Secondary | ICD-10-CM

## 2023-10-15 DIAGNOSIS — R29898 Other symptoms and signs involving the musculoskeletal system: Secondary | ICD-10-CM

## 2023-10-15 DIAGNOSIS — M542 Cervicalgia: Secondary | ICD-10-CM

## 2023-10-15 DIAGNOSIS — M546 Pain in thoracic spine: Secondary | ICD-10-CM | POA: Diagnosis not present

## 2023-10-15 DIAGNOSIS — M6281 Muscle weakness (generalized): Secondary | ICD-10-CM
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
## 2023-10-17 ENCOUNTER — Other Ambulatory Visit: Payer: Self-pay

## 2023-10-17 ENCOUNTER — Ambulatory Visit

## 2023-10-17 DIAGNOSIS — M6281 Muscle weakness (generalized): Secondary | ICD-10-CM

## 2023-10-17 DIAGNOSIS — R293 Abnormal posture: Secondary | ICD-10-CM

## 2023-10-17 DIAGNOSIS — M542 Cervicalgia: Secondary | ICD-10-CM

## 2023-10-17 DIAGNOSIS — M546 Pain in thoracic spine: Secondary | ICD-10-CM | POA: Diagnosis not present

## 2023-10-17 DIAGNOSIS — R29898 Other symptoms and signs involving the musculoskeletal system: Secondary | ICD-10-CM
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
## 2023-10-29 ENCOUNTER — Ambulatory Visit

## 2023-10-31 ENCOUNTER — Ambulatory Visit

## 2023-10-31 DIAGNOSIS — M6281 Muscle weakness (generalized): Secondary | ICD-10-CM

## 2023-10-31 DIAGNOSIS — R29898 Other symptoms and signs involving the musculoskeletal system: Secondary | ICD-10-CM

## 2023-10-31 DIAGNOSIS — R293 Abnormal posture: Secondary | ICD-10-CM

## 2023-10-31 DIAGNOSIS — M546 Pain in thoracic spine: Secondary | ICD-10-CM

## 2023-10-31 DIAGNOSIS — M542 Cervicalgia: Secondary | ICD-10-CM
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
## 2023-11-15 ENCOUNTER — Ambulatory Visit

## 2024-02-26 ENCOUNTER — Telehealth: Payer: Self-pay | Admitting: Cardiology

## 2024-02-26 NOTE — Telephone Encounter (Signed)
 Returned call to pt. No answer. Left msg to call back.

## 2024-02-26 NOTE — Telephone Encounter (Signed)
 Stress likely contributing. Recommend taking hydralazine  25 mg 2 pills twice daily for next 2 weeks.  Thanks MJP

## 2024-02-26 NOTE — Telephone Encounter (Signed)
 Spoke with pt who reports that her blood pressure has been elevated over the last 3 days. Her current BP's are 177/100, 147/77, 175/99. Pt does feel tired. She reports dizziness and headaches at times. She does have an appt with her PCP today at 11:45 am. Pt states that she was taking Hydralazine  as needed but has since started taking it daily. She reports that her blood pressure is higher at night. Please advise.

## 2024-02-26 NOTE — Telephone Encounter (Signed)
 Pt informed of dose change for the next 2 weeks. Pt voiced understanding a agrees with plan of care.

## 2024-02-26 NOTE — Telephone Encounter (Signed)
 Pt c/o BP issue: STAT if pt c/o blurred vision, one-sided weakness or slurred speech  1. What are your last 5 BP readings? 177/100 now   2. Are you having any other symptoms (ex. Dizziness, headache, blurred vision, passed out)? Just very tired/ fatigue   3. What is your BP issue? Pt is concerned about her BP being elevated. Please advise.

## 2024-02-29 ENCOUNTER — Other Ambulatory Visit: Payer: Self-pay | Admitting: Internal Medicine

## 2024-02-29 DIAGNOSIS — Z1231 Encounter for screening mammogram for malignant neoplasm of breast: Secondary | ICD-10-CM

## 2024-03-14 ENCOUNTER — Ambulatory Visit
Admission: RE | Admit: 2024-03-14 | Discharge: 2024-03-14 | Disposition: A | Discharge status: Home / Self Care | Source: Ambulatory Visit | Attending: Internal Medicine | Admitting: Internal Medicine

## 2024-03-14 DIAGNOSIS — Z1231 Encounter for screening mammogram for malignant neoplasm of breast: Secondary | ICD-10-CM

## 2024-04-10 ENCOUNTER — Other Ambulatory Visit

## 2024-04-14 ENCOUNTER — Encounter: Payer: Self-pay | Admitting: Radiology

## 2024-05-02 LAB — LAB REPORT - SCANNED
A1c: 5.6
EGFR: 86

## 2024-05-14 ENCOUNTER — Encounter: Payer: Self-pay | Admitting: Cardiology

## 2024-05-14 ENCOUNTER — Ambulatory Visit: Attending: Cardiology | Admitting: Cardiology

## 2024-05-14 VITALS — BP 148/72 | HR 79 | Ht 62.0 in | Wt 131.8 lb

## 2024-05-14 DIAGNOSIS — I700 Atherosclerosis of aorta: Secondary | ICD-10-CM

## 2024-05-14 DIAGNOSIS — I10 Essential (primary) hypertension: Secondary | ICD-10-CM

## 2024-05-14 NOTE — Progress Notes (Signed)
  Cardiology Office Note:  .   Date:  05/14/2024  ID:  Renee Fuentes, DOB Apr 06, 1956, MRN [id number] PCP: Valma Carwin, MD  [facility] Providers Cardiologist:  Newman Lawrence, MD PCP: Valma Carwin, MD  Chief Complaint  Patient presents with   Hypertension      History of Present Illness: Renee Fuentes    Renee Fuentes is a 68 y.o. female with hypertension, aortic atherosclerosis, GERD  Patient has had recent primary stressors.  During those days, her blood pressure had been elevated.  She was taking hydralazine  25 mg every day, has not taken last 3 days.  Blood pressure has been improved in the last few days, but her blood pressure machine is not working well now.  She denies any chest pain, shortness of breath.  She did have shoulder pain, has improved  Vitals:   05/14/24 1311  BP: (!) 148/72  Pulse: 79  SpO2: 99%      ROS:  Review of Systems  Cardiovascular:  Negative for chest pain, dyspnea on exertion, leg swelling, palpitations and syncope.  Musculoskeletal:  Positive for back pain.     Studies Reviewed: Renee Fuentes        EKG 05/14/2024: Normal sinus rhythm Nonspecific ST abnormality When compared with ECG of 18-Sep-2020 03:15, No significant change was found  Independently interpreted 12/2022: Cr 0.8 Hb 12.8   CCTA 12/2022: 1. Coronary calcium  score of 0. 2. Normal coronary origin with right dominance. 3. Aortic atherosclerosis. 4. CAD-RADS = 0 No evidence of CAD within the limitation of the study (artifact).   Physical Exam:   Physical Exam Vitals and nursing note reviewed.  Constitutional:      General: She is not in acute distress. Neck:     Vascular: No JVD.  Cardiovascular:     Rate and Rhythm: Normal rate and regular rhythm.     Heart sounds: Normal heart sounds. No murmur heard. Pulmonary:     Effort: Pulmonary effort is normal.     Breath sounds: Normal breath sounds. No wheezing or rales.  Musculoskeletal:     Right lower  leg: No edema.     Left lower leg: No edema.      VISIT DIAGNOSES:   ICD-10-CM   1. Primary hypertension  I10 EKG 12-Lead    2. Aortic atherosclerosis  I70.0         ASSESSMENT AND PLAN: .    Renee Fuentes is a 68 y.o. female with hypertension, aortic atherosclerosis, GERD  Hypertension: She has been taking hydralazine  25 mg as needed, took every day during periods of family stress.  Today, she is emotional talking about death in family.  Blood pressure is elevated.  I did not make any change today, but encouraged her to check blood pressure regularly over a week.  If consistently stays >130/80 mmHg, I would recommend amlodipine 2.5 mg for daily use, and take hydralazine  only as needed.  Aortic atherosclerosis: Continue Crestor  10 mg daily.    F/u in 1 year  Signed, Newman JINNY Lawrence, MD

## 2024-05-14 NOTE — Patient Instructions (Signed)
 Follow-Up: At [facility], you and your health needs are our priority.  As part of our continuing mission to provide you with exceptional heart care, our providers are all part of one team.  This team includes your primary Cardiologist (physician) and Advanced Practice Providers or APPs (Physician Assistants and Nurse Practitioners) who all work together to provide you with the care you need, when you need it.  Your next appointment:   1 year(s)  Provider:   Cody Das, MD

## 2024-05-29 ENCOUNTER — Ambulatory Visit: Admitting: Cardiology

## 2024-06-03 ENCOUNTER — Encounter: Payer: Self-pay | Admitting: Cardiology

## 2024-06-03 DIAGNOSIS — R030 Elevated blood-pressure reading, without diagnosis of hypertension: Secondary | ICD-10-CM

## 2024-06-03 MED ORDER — HYDRALAZINE HCL 25 MG PO TABS: 25.0000 mg | ORAL_TABLET | ORAL | 3 refills | Status: AC | PRN

## 2024-06-09 ENCOUNTER — Encounter: Payer: Self-pay | Admitting: Cardiology

## 2024-06-23 ENCOUNTER — Ambulatory Visit: Payer: Self-pay | Admitting: Cardiology
